# Patient Record
Sex: Female | Born: 1965 | Race: Black or African American | Hispanic: No | Marital: Married | Smoking: Never smoker
Health system: Southern US, Community
[De-identification: ages and names within clinical notes are randomized; demographics above are authoritative.]

## PROBLEM LIST (undated history)

## (undated) DIAGNOSIS — J329 Chronic sinusitis, unspecified: Secondary | ICD-10-CM

## (undated) DIAGNOSIS — Z1371 Encounter for nonprocreative screening for genetic disease carrier status: Secondary | ICD-10-CM

## (undated) DIAGNOSIS — N760 Acute vaginitis: Secondary | ICD-10-CM

## (undated) DIAGNOSIS — K219 Gastro-esophageal reflux disease without esophagitis: Secondary | ICD-10-CM

## (undated) DIAGNOSIS — T7840XA Allergy, unspecified, initial encounter: Secondary | ICD-10-CM

## (undated) DIAGNOSIS — B9689 Other specified bacterial agents as the cause of diseases classified elsewhere: Secondary | ICD-10-CM

## (undated) HISTORY — DX: Encounter for nonprocreative screening for genetic disease carrier status: Z13.71

## (undated) HISTORY — DX: Allergy, unspecified, initial encounter: T78.40XA

## (undated) HISTORY — PX: INDUCED ABORTION: SHX677

## (undated) HISTORY — PX: BREAST EXCISIONAL BIOPSY: SUR124

## (undated) HISTORY — DX: Other specified bacterial agents as the cause of diseases classified elsewhere: N76.0

## (undated) HISTORY — DX: Chronic sinusitis, unspecified: J32.9

## (undated) HISTORY — PX: MYOMECTOMY: SHX85

## (undated) HISTORY — DX: Other specified bacterial agents as the cause of diseases classified elsewhere: B96.89

---

## 1994-09-09 HISTORY — PX: CERVICAL BIOPSY  W/ LOOP ELECTRODE EXCISION: SUR135

## 1998-07-12 ENCOUNTER — Other Ambulatory Visit: Admission: RE | Admit: 1998-07-12 | Discharge: 1998-07-12 | Payer: Self-pay | Admitting: Gynecology

## 1999-07-18 ENCOUNTER — Other Ambulatory Visit: Admission: RE | Admit: 1999-07-18 | Discharge: 1999-07-18 | Payer: Self-pay | Admitting: Gynecology

## 1999-08-17 ENCOUNTER — Encounter: Payer: Self-pay | Admitting: Gynecology

## 1999-08-17 ENCOUNTER — Encounter: Admission: RE | Admit: 1999-08-17 | Discharge: 1999-08-17 | Payer: Self-pay | Admitting: Gynecology

## 1999-08-27 ENCOUNTER — Encounter: Payer: Self-pay | Admitting: Gynecology

## 1999-08-27 ENCOUNTER — Encounter: Admission: RE | Admit: 1999-08-27 | Discharge: 1999-08-27 | Payer: Self-pay | Admitting: Gynecology

## 2000-02-25 ENCOUNTER — Encounter: Admission: RE | Admit: 2000-02-25 | Discharge: 2000-02-25 | Payer: Self-pay | Admitting: Gynecology

## 2000-02-25 ENCOUNTER — Encounter: Payer: Self-pay | Admitting: Gynecology

## 2000-08-11 ENCOUNTER — Other Ambulatory Visit: Admission: RE | Admit: 2000-08-11 | Discharge: 2000-08-11 | Payer: Self-pay | Admitting: Gynecology

## 2000-08-27 ENCOUNTER — Encounter: Admission: RE | Admit: 2000-08-27 | Discharge: 2000-08-27 | Payer: Self-pay | Admitting: Gynecology

## 2000-08-27 ENCOUNTER — Encounter: Payer: Self-pay | Admitting: Gynecology

## 2001-10-16 ENCOUNTER — Encounter (INDEPENDENT_AMBULATORY_CARE_PROVIDER_SITE_OTHER): Payer: Self-pay | Admitting: Specialist

## 2001-10-16 ENCOUNTER — Ambulatory Visit (HOSPITAL_COMMUNITY): Admission: RE | Admit: 2001-10-16 | Discharge: 2001-10-16 | Payer: Self-pay | Admitting: Gynecology

## 2001-11-09 ENCOUNTER — Other Ambulatory Visit: Admission: RE | Admit: 2001-11-09 | Discharge: 2001-11-09 | Payer: Self-pay | Admitting: Gynecology

## 2001-12-04 ENCOUNTER — Encounter: Payer: Self-pay | Admitting: Gynecology

## 2001-12-04 ENCOUNTER — Encounter: Admission: RE | Admit: 2001-12-04 | Discharge: 2001-12-04 | Payer: Self-pay | Admitting: Gynecology

## 2002-12-02 ENCOUNTER — Other Ambulatory Visit: Admission: RE | Admit: 2002-12-02 | Discharge: 2002-12-02 | Payer: Self-pay | Admitting: Gynecology

## 2002-12-21 ENCOUNTER — Encounter: Payer: Self-pay | Admitting: Gynecology

## 2002-12-21 ENCOUNTER — Encounter: Admission: RE | Admit: 2002-12-21 | Discharge: 2002-12-21 | Payer: Self-pay | Admitting: Gynecology

## 2003-01-20 ENCOUNTER — Encounter: Admission: RE | Admit: 2003-01-20 | Discharge: 2003-04-20 | Payer: Self-pay | Admitting: Gynecology

## 2003-12-29 ENCOUNTER — Other Ambulatory Visit: Admission: RE | Admit: 2003-12-29 | Discharge: 2003-12-29 | Payer: Self-pay | Admitting: Gynecology

## 2004-01-05 ENCOUNTER — Encounter: Admission: RE | Admit: 2004-01-05 | Discharge: 2004-01-05 | Payer: Self-pay | Admitting: Gynecology

## 2004-04-12 ENCOUNTER — Encounter: Admission: RE | Admit: 2004-04-12 | Discharge: 2004-04-12 | Payer: Self-pay | Admitting: Gynecology

## 2004-04-24 ENCOUNTER — Encounter: Admission: RE | Admit: 2004-04-24 | Discharge: 2004-04-24 | Payer: Self-pay | Admitting: Interventional Radiology

## 2004-06-09 HISTORY — PX: UTERINE ARTERY EMBOLIZATION: SHX2629

## 2004-06-21 ENCOUNTER — Observation Stay (HOSPITAL_COMMUNITY): Admission: RE | Admit: 2004-06-21 | Discharge: 2004-06-22 | Payer: Self-pay | Admitting: Interventional Radiology

## 2004-12-18 ENCOUNTER — Encounter: Admission: RE | Admit: 2004-12-18 | Discharge: 2004-12-18 | Payer: Self-pay | Admitting: Interventional Radiology

## 2005-01-09 ENCOUNTER — Encounter: Admission: RE | Admit: 2005-01-09 | Discharge: 2005-01-09 | Payer: Self-pay | Admitting: Gynecology

## 2005-01-10 ENCOUNTER — Other Ambulatory Visit: Admission: RE | Admit: 2005-01-10 | Discharge: 2005-01-10 | Payer: Self-pay | Admitting: Gynecology

## 2006-01-21 ENCOUNTER — Encounter: Admission: RE | Admit: 2006-01-21 | Discharge: 2006-01-21 | Payer: Self-pay | Admitting: Gynecology

## 2006-01-30 ENCOUNTER — Other Ambulatory Visit: Admission: RE | Admit: 2006-01-30 | Discharge: 2006-01-30 | Payer: Self-pay | Admitting: Gynecology

## 2007-01-28 ENCOUNTER — Encounter: Admission: RE | Admit: 2007-01-28 | Discharge: 2007-01-28 | Payer: Self-pay | Admitting: Gynecology

## 2007-02-04 ENCOUNTER — Other Ambulatory Visit: Admission: RE | Admit: 2007-02-04 | Discharge: 2007-02-04 | Payer: Self-pay | Admitting: Gynecology

## 2008-02-02 ENCOUNTER — Encounter: Admission: RE | Admit: 2008-02-02 | Discharge: 2008-02-02 | Payer: Self-pay | Admitting: Gynecology

## 2008-02-05 ENCOUNTER — Other Ambulatory Visit: Admission: RE | Admit: 2008-02-05 | Discharge: 2008-02-05 | Payer: Self-pay | Admitting: Gynecology

## 2008-07-25 ENCOUNTER — Ambulatory Visit: Payer: Self-pay | Admitting: Gynecology

## 2009-02-02 ENCOUNTER — Encounter: Admission: RE | Admit: 2009-02-02 | Discharge: 2009-02-02 | Payer: Self-pay | Admitting: Gynecology

## 2009-02-17 ENCOUNTER — Ambulatory Visit: Payer: Self-pay | Admitting: Gynecology

## 2009-02-17 ENCOUNTER — Other Ambulatory Visit: Admission: RE | Admit: 2009-02-17 | Discharge: 2009-02-17 | Payer: Self-pay | Admitting: Gynecology

## 2009-02-17 ENCOUNTER — Encounter: Payer: Self-pay | Admitting: Gynecology

## 2009-02-22 ENCOUNTER — Ambulatory Visit: Payer: Self-pay | Admitting: Gynecology

## 2009-11-11 ENCOUNTER — Emergency Department (HOSPITAL_COMMUNITY): Admission: EM | Admit: 2009-11-11 | Discharge: 2009-11-11 | Payer: Self-pay | Admitting: Family Medicine

## 2010-02-06 ENCOUNTER — Encounter: Admission: RE | Admit: 2010-02-06 | Discharge: 2010-02-06 | Payer: Self-pay | Admitting: Gynecology

## 2010-02-27 ENCOUNTER — Other Ambulatory Visit: Admission: RE | Admit: 2010-02-27 | Discharge: 2010-02-27 | Payer: Self-pay | Admitting: Gynecology

## 2010-02-27 ENCOUNTER — Ambulatory Visit: Payer: Self-pay | Admitting: Gynecology

## 2010-03-05 ENCOUNTER — Ambulatory Visit: Payer: Self-pay | Admitting: Gynecology

## 2010-08-24 ENCOUNTER — Emergency Department (HOSPITAL_COMMUNITY)
Admission: EM | Admit: 2010-08-24 | Discharge: 2010-08-24 | Payer: Self-pay | Source: Home / Self Care | Admitting: Family Medicine

## 2011-01-25 NOTE — Op Note (Signed)
[facility] of [facility]  Patient:    Cassandra Vaughn, Cassandra Vaughn Visit Number: [id number] MRN: [id number]          Service Type: DSU Location: [facility] Attending Physician:  Tonye Royalty Dictated by:   Gaetano Hawthorne. Lily Peer, M.D. Proc. Date: 10/16/01 Admit Date:  10/16/2001                             Operative Report  PREOPERATIVE DIAGNOSES:       1. Dysfunctional uterine bleeding.                               2. Questionable intrauterine polyp, questionable                                  myoma.  POSTOPERATIVE DIAGNOSIS:      Asherman syndrome.  OPERATION:                    Diagnostic hysteroscopy with D&C.  SURGEON:                      Juan H. Lily Peer, M.D.  ANESTHESIA:                   MAC, paracervical block, intravenous sedation.  INDICATIONS FOR PROCEDURE:    A 45 year old gravida 2, para 2, with history of dysfunctional uterine bleeding, history of multiple myomectomies in the past. Office sonohistogram suspicious for uterine segment myoma versus polyp.  The patient had received 11.25 mg of Lupron approximately three months ago.  FINDINGS:                     Extensive intrauterine adhesions were noted. Tubal ostia identified.  Narrow cervical canal.  DESCRIPTION OF PROCEDURE:     After the patient was adequately counseled, she was taken to the operating room where she underwent intravenous sedation.  She was placed in the low lithotomy position.  She received 1 g of Cefotan for prophylaxis preoperatively.  The vagina and perineum were prepped and draped in the usual sterile fashion.  The laminaria that was placed previously in the office was removed with some tension required to remove it due to the narrowness of the cervical canal.  Once this was removed, the vagina was cleansed with Betadine solution.  One percent lidocaine was infiltrated into the cervical stroma at the 2, 4, 8, and 10 oclock position.  The uterus sounded to approximately 7  cm and Pratt dilator to a size 23 was safely introduced into the endocervical canal into the uterine cavity.  Once this was accomplished, a small diagnostic hysteroscope was introduced into the intrauterine cavity.  Three percent sorbitol was the distending media, and initially, the anterior uterine pressure was at 80 mmHg, and was increased to 100 mm in effort to distend the uterus for better visualization. It was somewhat difficult due to the amount of adhesions that were present, and so vigorous curettage was performed, interchanged with the suction curet, followed by reinsertion of the diagnostic scope.  At this time, both tubal ostia were identified, and the intrauterine adhesions had been broken down, and appears into the angle of the uterus that may have been a missed conception of whether it was polyp or myoma in the lower uterine segment of  the uterus, and was not seen at this time after verification of the intrauterine cavity.  Once this was completed, the tenaculum was removed, and the patient was awakened, and transferred to the recovery room with stable vital signs.  She did receive 30 mg of Toradol on arrival to the recovery room.  Blood loss was minimal.  Fluid deficit from the 3% sorbitol distending media was less than 30 cc.  The patient received a total of 900 cc of lactated Ringers. Dictated by:   Gaetano Hawthorne Lily Peer, M.D. Attending Physician:  Tonye Royalty DD:  10/16/01 TD:  10/17/01 Job: [id number]

## 2011-01-25 NOTE — H&P (Signed)
[facility] of [facility]  Patient:    Cassandra Vaughn, Cassandra Vaughn Visit Number: [id number] MRN: [id number]          Service Type: Attending:  Gaetano Hawthorne. Lily Peer, M.D. Dictated by:   Gaetano Hawthorne Lily Peer, M.D. Adm. Date:  10/16/01                           History and Physical  CHIEF COMPLAINT:              1. Dysfunctional uterine bleeding.                               2. 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  HISTORY:                      The patient is a 45 year old gravida 2, para 2 who was seen in the office on August 26, 2001.  Patient in 1998 had an abdominal myomectomy whereby numerous fibroids had been removed.  She was seen in the office on July 09, 2001 and prior to that had been complaining of irregular menses for the previous four to five months.  She had been on Ortho-Novum 7/7/7, but was discontinued since she had reached the age of 27 and had been switched to 20 mcg pill such as Alesse.  She has had a sonohysterogram and an endometrial biopsy.  The sonohysterogram had demonstrated posterior wall defect measuring 18 x 10 x 17 mm questionable fibroid versus a polyp.  Attempt at endometrial biopsy was difficult and due to stenotic os she was placed on Lupron 11.25 mg IM and when she returned to the office required serial dilatation of her cervix in effort to facilitate the insertion of the catheter to proceed with a sonohysterogram.  Endometrial biopsy was attempted but minimal tissue was obtained at that time.  She was seen in the office today, February 6, for placement of a laminaria which required also local paracervical block, serial dilatation of the cervix, and placement of the laminaria tent.  Patients last Pap smear was November 2001 which was normal.  ALLERGIES:                    Denies.  PAST MEDICAL HISTORY:         She had a myomectomy in 1998.  She had received Lupron 11.25 in November.  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  She has had LEEP cervical conization 1996.  She has suspicious mammograms but she has had mammograms every six months.  FAMILY HISTORY:               Sister with history of breast cancer.  Father with history of diabetes and hypertension.  PHYSICAL EXAMINATION  GENERAL:                      Well-developed, well-nourished female.  HEENT:                        Unremarkable.  NECK:                         Supple.  Trachea:  Midline.  No carotid bruits. No thyromegaly.  LUNGS:  Clear to auscultation without rhonchi or wheezes.  HEART:                        Regular rate and rhythm.  No murmurs or gallops.  BREASTS:                      Not done.  ABDOMEN:                      Soft, nontender without rebound or guarding.  PELVIC:                       Bartholin, urethral, and Skenes glands within normal limits.  Vagina, cervix:  No lesions or discharge.  Cervix:  Slightly retroverted.  Normal size, shape, and consistency.  Adnexa without any mass or tenderness.  RECTAL:                       Deferred.  ASSESSMENT:                   A 45 year old gravida 2, para 2 with history of dysfunctional uterine bleeding attributed to possible endometrial polyp versus submucous myoma in the posterior uterine wall.  Patient received Lupron 11.25 mg in November and had laminaria placed intracervically today in effort to facilitate the insertion of the hysteroscope at the time of her surgery.  She had a CBC in the office back on January 27 with hemoglobin and hematocrit of 13.9 and 41.4 respectively with a normal platelet count.  Risks, benefits, pros, and cons of resectoscopic polypectomy and myomectomy were discussed including infection, bleeding, trauma to internal organs, perforation of the uterus.  She will receive prophylactic antibiotics, 1 g Cefotan.  Of note, today in the office she underwent paracervical block whereby 1%  lidocaine was infiltrated into the cervical stroma at the 2, 4, 8, and 10 oclock position and required serial dilatation of the cervix to safely place the laminaria. She was also given a prescription for Lortab 7.5/500 to take one p.o. q.4-6h. p.r.n. after the procedure.  All questions were answered and will follow accordingly.  PLAN:                         Patient is scheduled for resectoscopic polypectomy/myomectomy and endometrial biopsy tomorrow at [facility] at 7:30 a.m. Dictated by:   Gaetano Hawthorne Lily Peer, M.D. Attending:  Gaetano Hawthorne. Lily Peer, M.D. DD:  10/15/01 TD:  10/15/01 Job: [id number]

## 2011-02-18 ENCOUNTER — Other Ambulatory Visit: Payer: Self-pay | Admitting: Gynecology

## 2011-02-18 DIAGNOSIS — Z1231 Encounter for screening mammogram for malignant neoplasm of breast: Secondary | ICD-10-CM

## 2011-02-28 ENCOUNTER — Ambulatory Visit
Admission: RE | Admit: 2011-02-28 | Discharge: 2011-02-28 | Disposition: A | Discharge status: Home / Self Care | Payer: BC Managed Care – PPO | Source: Ambulatory Visit | Attending: Gynecology | Admitting: Gynecology

## 2011-02-28 DIAGNOSIS — Z1231 Encounter for screening mammogram for malignant neoplasm of breast: Secondary | ICD-10-CM

## 2011-03-14 ENCOUNTER — Encounter: Payer: Self-pay | Admitting: Gynecology

## 2011-03-19 ENCOUNTER — Encounter (INDEPENDENT_AMBULATORY_CARE_PROVIDER_SITE_OTHER): Payer: BC Managed Care – PPO | Admitting: Gynecology

## 2011-03-19 ENCOUNTER — Other Ambulatory Visit: Payer: Self-pay | Admitting: Gynecology

## 2011-03-19 ENCOUNTER — Other Ambulatory Visit (HOSPITAL_COMMUNITY)
Admission: RE | Admit: 2011-03-19 | Discharge: 2011-03-19 | Disposition: A | Discharge status: Home / Self Care | Payer: BC Managed Care – PPO | Source: Ambulatory Visit | Attending: Gynecology | Admitting: Gynecology

## 2011-03-19 DIAGNOSIS — Z833 Family history of diabetes mellitus: Secondary | ICD-10-CM

## 2011-03-19 DIAGNOSIS — Z124 Encounter for screening for malignant neoplasm of cervix: Secondary | ICD-10-CM | POA: Insufficient documentation

## 2011-03-19 DIAGNOSIS — R635 Abnormal weight gain: Secondary | ICD-10-CM

## 2011-03-19 DIAGNOSIS — Z01419 Encounter for gynecological examination (general) (routine) without abnormal findings: Secondary | ICD-10-CM

## 2011-03-19 DIAGNOSIS — Z13220 Encounter for screening for lipoid disorders: Secondary | ICD-10-CM

## 2011-03-20 ENCOUNTER — Encounter: Payer: Self-pay | Admitting: *Deleted

## 2011-03-26 ENCOUNTER — Other Ambulatory Visit: Payer: BC Managed Care – PPO

## 2011-03-26 ENCOUNTER — Ambulatory Visit (INDEPENDENT_AMBULATORY_CARE_PROVIDER_SITE_OTHER): Payer: BC Managed Care – PPO | Admitting: Gynecology

## 2011-03-26 DIAGNOSIS — N920 Excessive and frequent menstruation with regular cycle: Secondary | ICD-10-CM

## 2011-03-26 DIAGNOSIS — D259 Leiomyoma of uterus, unspecified: Secondary | ICD-10-CM

## 2011-03-26 DIAGNOSIS — N8300 Follicular cyst of ovary, unspecified side: Secondary | ICD-10-CM

## 2011-03-26 DIAGNOSIS — N852 Hypertrophy of uterus: Secondary | ICD-10-CM

## 2012-03-03 ENCOUNTER — Other Ambulatory Visit: Payer: Self-pay | Admitting: Gynecology

## 2012-03-03 DIAGNOSIS — Z1231 Encounter for screening mammogram for malignant neoplasm of breast: Secondary | ICD-10-CM

## 2012-03-23 ENCOUNTER — Ambulatory Visit
Admission: RE | Admit: 2012-03-23 | Discharge: 2012-03-23 | Disposition: A | Discharge status: Home / Self Care | Payer: BC Managed Care – PPO | Source: Ambulatory Visit | Attending: Gynecology | Admitting: Gynecology

## 2012-03-23 DIAGNOSIS — Z1231 Encounter for screening mammogram for malignant neoplasm of breast: Secondary | ICD-10-CM

## 2012-03-24 ENCOUNTER — Other Ambulatory Visit (HOSPITAL_COMMUNITY)
Admission: RE | Admit: 2012-03-24 | Discharge: 2012-03-24 | Disposition: A | Discharge status: Home / Self Care | Payer: BC Managed Care – PPO | Source: Ambulatory Visit | Attending: Gynecology | Admitting: Gynecology

## 2012-03-24 ENCOUNTER — Encounter: Payer: Self-pay | Admitting: Gynecology

## 2012-03-24 ENCOUNTER — Ambulatory Visit (INDEPENDENT_AMBULATORY_CARE_PROVIDER_SITE_OTHER): Payer: BC Managed Care – PPO | Admitting: Gynecology

## 2012-03-24 VITALS — BP 120/76 | Ht 65.0 in | Wt 150.0 lb

## 2012-03-24 DIAGNOSIS — Z833 Family history of diabetes mellitus: Secondary | ICD-10-CM | POA: Insufficient documentation

## 2012-03-24 DIAGNOSIS — Z01419 Encounter for gynecological examination (general) (routine) without abnormal findings: Secondary | ICD-10-CM

## 2012-03-24 DIAGNOSIS — D259 Leiomyoma of uterus, unspecified: Secondary | ICD-10-CM

## 2012-03-24 DIAGNOSIS — Z1151 Encounter for screening for human papillomavirus (HPV): Secondary | ICD-10-CM | POA: Insufficient documentation

## 2012-03-24 DIAGNOSIS — R634 Abnormal weight loss: Secondary | ICD-10-CM

## 2012-03-24 LAB — CBC WITH DIFFERENTIAL/PLATELET
Basophils Absolute: 0 10*3/uL (ref 0.0–0.1)
Basophils Relative: 0 % (ref 0–1)
Eosinophils Absolute: 0 10*3/uL (ref 0.0–0.7)
Eosinophils Relative: 1 % (ref 0–5)
HCT: 44.7 % (ref 36.0–46.0)
Hemoglobin: 15 g/dL (ref 12.0–15.0)
Lymphocytes Relative: 40 % (ref 12–46)
Lymphs Abs: 1.9 10*3/uL (ref 0.7–4.0)
MCH: 31.1 pg (ref 26.0–34.0)
MCHC: 33.6 g/dL (ref 30.0–36.0)
MCV: 92.5 fL (ref 78.0–100.0)
Monocytes Absolute: 0.5 10*3/uL (ref 0.1–1.0)
Monocytes Relative: 11 % (ref 3–12)
Neutro Abs: 2.3 10*3/uL (ref 1.7–7.7)
Neutrophils Relative %: 48 % (ref 43–77)
Platelets: 243 10*3/uL (ref 150–400)
RBC: 4.83 MIL/uL (ref 3.87–5.11)
RDW: 13.8 % (ref 11.5–15.5)
WBC: 4.8 10*3/uL (ref 4.0–10.5)

## 2012-03-24 LAB — LIPID PANEL
Cholesterol: 227 mg/dL — ABNORMAL HIGH (ref 0–200)
HDL: 92 mg/dL (ref >39)
LDL Cholesterol: 119 mg/dL — ABNORMAL HIGH (ref 0–99)
Total CHOL/HDL Ratio: 2.5 Ratio
Triglycerides: 81 mg/dL (ref <150)
VLDL: 16 mg/dL (ref 0–40)

## 2012-03-24 LAB — TSH: TSH: 1.697 u[IU]/mL (ref 0.350–4.500)

## 2012-03-24 LAB — GLUCOSE, RANDOM: Glucose, Bld: 77 mg/dL (ref 70–99)

## 2012-03-24 MED ORDER — LEVONORGESTREL-ETHINYL ESTRAD 0.1-20 MG-MCG PO TABS: 1.0000 | ORAL_TABLET | Freq: Every day | ORAL | Status: DC

## 2012-03-24 NOTE — Patient Instructions (Addendum)

## 2012-03-24 NOTE — Progress Notes (Signed)
Cassandra Vaughn [birth year] [id number]   History:    46 y.o.  for annual gyn exam with no complaints today. Patient on oral contraceptive pills and is having normal menstrual cycle. Patient had abdominal myomectomy in 1996 and 2005. She also had a uterine artery embolization 2005. Patient a LEEP cervical conization in 1996 followup Pap smears have been normal. Patient's sister with history of breast cancer. Patient has been tested for BRCA1 and BRCA2 mutation and was negative. Patient frequently does her self breast examination. Her mammogram was done yesterday results pending at time of this dictation. Patient is exercising eating better she was weighing 159 is down to 150. Patient with strong family history of diabetes.  Past medical history,surgical history, family history and social history were all reviewed and documented in the EPIC chart.  Gynecologic History Patient's last menstrual period was 03/17/2012. Contraception: OCP (estrogen/progesterone) Last Pap: 2012. Results were: normal Last mammogram: 2013. Results were: Results pending  Obstetric History OB History    Grav Para Term Preterm Abortions TAB SAB Ect Mult Living   2 0   2     0     # Outc Date GA Lbr Len/2nd Wgt Sex Del Anes PTL Lv   1 ABT            2 ABT                ROS: A ROS was performed and pertinent positives and negatives are included in the history.  GENERAL: No fevers or chills. HEENT: No change in vision, no earache, sore throat or sinus congestion. NECK: No pain or stiffness. CARDIOVASCULAR: No chest pain or pressure. No palpitations. PULMONARY: No shortness of breath, cough or wheeze. GASTROINTESTINAL: No abdominal pain, nausea, vomiting or diarrhea, melena or bright red blood per rectum. GENITOURINARY: No urinary frequency, urgency, hesitancy or dysuria. MUSCULOSKELETAL: No joint or muscle pain, no back pain, no recent trauma. DERMATOLOGIC: No rash, no itching, no lesions. ENDOCRINE: No polyuria, polydipsia,  no heat or cold intolerance. No recent change in weight. HEMATOLOGICAL: No anemia or easy bruising or bleeding. NEUROLOGIC: No headache, seizures, numbness, tingling or weakness. PSYCHIATRIC: No depression, no loss of interest in normal activity or change in sleep pattern.     Exam: chaperone present  BP 120/76  Ht 5\' 5"  (1.651 m)  Wt 150 lb (68.04 kg)  BMI 24.96 kg/m2  LMP 03/17/2012  Body mass index is 24.96 kg/(m^2).  General appearance : Well developed well nourished female. No acute distress HEENT: Neck supple, trachea midline, no carotid bruits, no thyroidmegaly Lungs: Clear to auscultation, no rhonchi or wheezes, or rib retractions  Heart: Regular rate and rhythm, no murmurs or gallops Breast:Examined in sitting and supine position were symmetrical in appearance, no palpable masses or tenderness,  no skin retraction, no nipple inversion, no nipple discharge, no skin discoloration, no axillary or supraclavicular lymphadenopathy Abdomen: no palpable masses or tenderness, no rebound or guarding Extremities: no edema or skin discoloration or tenderness  Pelvic:  Bartholin, Urethra, Skene Glands: Within normal limits             Vagina: No gross lesions or discharge  Cervix: No gross lesions or discharge  Uterus  14 week size irregular shaped, nontender  Adnexa  difficult to assess due to patient's uterine size  Anus and perineum  normal   Rectovaginal  normal sphincter tone without palpated masses or tenderness             Hemoccult not  done     Assessment/Plan:  46 y.o. female for annual exam with history of leiomyomatous uteri. Uterus today 14 week size unable to assess adnexa due to the size of the uterus. Patient will return back to the office next week for an ultrasound to assess her adnexa as well as to compare uterine size with last years ultrasound scan. Patient has been asymptomatic otherwise and is having normal menstrual cycles. New Pap smear screening guidelines were  discussed. Pap smear was done today. We will try to obtain the records from the LEEP cervical conization and the Pap smear in 1996 to see the grade of dysplasia present to determine followup. The following labs are today: Fasting lipid profile, fasting blood sugar, TSH, CBC, and urinalysis. Patient was encouraged to continue her monthly self breast examination.    Ok Edwards MD, 11:34 AM 03/24/2012

## 2012-03-25 ENCOUNTER — Other Ambulatory Visit: Payer: Self-pay | Admitting: Gynecology

## 2012-03-25 DIAGNOSIS — E7800 Pure hypercholesterolemia, unspecified: Secondary | ICD-10-CM

## 2012-03-25 LAB — URINALYSIS W MICROSCOPIC + REFLEX CULTURE
Bilirubin Urine: NEGATIVE
Casts: NONE SEEN
Crystals: NONE SEEN
Glucose, UA: NEGATIVE mg/dL
Hgb urine dipstick: NEGATIVE
Ketones, ur: NEGATIVE mg/dL
Leukocytes, UA: NEGATIVE
Nitrite: NEGATIVE
Protein, ur: NEGATIVE mg/dL
Specific Gravity, Urine: 1.02 (ref 1.005–1.030)
Squamous Epithelial / HPF: NONE SEEN
Urobilinogen, UA: 0.2 mg/dL (ref 0.0–1.0)
pH: 7.5 (ref 5.0–8.0)

## 2012-04-01 ENCOUNTER — Ambulatory Visit (INDEPENDENT_AMBULATORY_CARE_PROVIDER_SITE_OTHER): Payer: BC Managed Care – PPO | Admitting: Gynecology

## 2012-04-01 ENCOUNTER — Encounter: Payer: Self-pay | Admitting: Gynecology

## 2012-04-01 ENCOUNTER — Other Ambulatory Visit: Payer: Self-pay | Admitting: Gynecology

## 2012-04-01 ENCOUNTER — Ambulatory Visit (INDEPENDENT_AMBULATORY_CARE_PROVIDER_SITE_OTHER): Payer: BC Managed Care – PPO

## 2012-04-01 VITALS — BP 124/80

## 2012-04-01 DIAGNOSIS — D259 Leiomyoma of uterus, unspecified: Secondary | ICD-10-CM

## 2012-04-01 DIAGNOSIS — D251 Intramural leiomyoma of uterus: Secondary | ICD-10-CM

## 2012-04-01 DIAGNOSIS — D252 Subserosal leiomyoma of uterus: Secondary | ICD-10-CM

## 2012-04-01 DIAGNOSIS — N852 Hypertrophy of uterus: Secondary | ICD-10-CM

## 2012-04-01 NOTE — Patient Instructions (Signed)
Total Laparoscopic Hysterectomy A total laparoscopic hysterectomy is a minimally invasive surgery to remove your uterus and cervix. This surgery is performed by making several small cuts (incisions) in your abdomen. It can also be done with a thin, lighted tube (laparoscope) inserted into 2 small incisions in the lower abdomen. Your fallopian tubes and ovaries can be removed (bilateral salpingo-oopherectomy) during this surgery as well.If a total laparoscopic hysterectomy is started and it is not safe to continue, the laparoscopic surgery will be converted to an open abdominal surgery. You will not have menstrual periods or be able to get pregnant after having this surgery. If a bilateral salpingo-oopherectomy was performed before menopause, you will go through a sudden (abrupt) menopause. This can be helped with hormone medicines. Benefits of minimally invasive surgery include:  Less pain.   Less risk of blood loss.   Less risk of infection.   Quicker return to normal activities.   Usually a 1 night stay in the hospital.   Overall patient satisfaction.  LET YOUR CAREGIVER KNOW ABOUT:  Any history of abnormal Pap tests.   Allergies to food or medicine.   Medicines taken, including vitamins, herbs, eyedrops, over-the-counter medicines, and creams.   Use of steroids (by mouth or creams).   Previous problems with anesthetics or numbing medicines.   History of bleeding problems or blood clots.   Previous surgery.   Other health problems, including diabetes and kidney problems.   Desire for future fertility.   Any infections or colds you may have developed.   Symptoms of irregular or heavy periods, weight loss, or urinary or bowel changes.  RISKS AND COMPLICATIONS   Bleeding.   Blood clots in the legs or lung.   Infection.   Injury to surrounding organs.   Problems with anesthesia.   Early menopause symptoms (hot flashes, night sweats, insomnia).   Risk of conversion  to an open abdominal incision.  BEFORE THE PROCEDURE  Ask your caregiver about changing or stopping your regular medicines.   Do not take aspirin or blood thinners (anticoagulants) for 1 week before the surgery, or as told by your caregiver.   Do not eat or drink anything for 8 hours before the surgery, or as told by your caregiver.   Quit smoking if you smoke.   Arrange for a ride home after surgery and for someone to help you at home during recovery.  PROCEDURE   You will be given antibiotic medicine.   An intravenous (IV) line will be placed in your arm. You will be given medicine to make you sleep (general anesthetic).   A gas (carbon dioxide) will be used to inflate your abdomen. This will allow your surgeon to look inside your abdomen, perform your surgery, and treat any other problems found if necessary.   Three or four small incisions (often less than  inch) will be made in your abdomen. One of these incisions will be made in the area of your belly button (navel). The laparoscope will be inserted into the incision. Your surgeon will look through the laparoscope while doing your procedure.   Other surgical instruments will be inserted through the other incisions.   The uterus may be removed through the vagina or cut into small pieces and removed through the small incisions.   Your incisions will be closed.  AFTER THE PROCEDURE  The gas will be released from inside your abdomen.   You will be taken to the recovery area where a nurse will watch and   check your progress. Once you are awake, stable, and taking fluids well, without other problems, you will return to your room or be allowed to go home.   There is usually minimal discomfort following the surgery because the incisions are so small.   You will be given pain medicine while you are in the hospital and for when you go home.   Try to have someone with you the first 3 to 5 days after you go home.   Follow up with  your surgeon in 2 to 4 weeks after surgery to evaluate your progress.  Document Released: 06/23/2007 Document Revised: 08/15/2011 Document Reviewed: 04/12/2011 [facility] Patient Information 2012 [place], [place].  Fibroids You have been diagnosed as having a fibroid. Fibroids are smooth muscle lumps (tumors) which can occur any place in a woman's body. They are usually in the womb (uterus). The most common problem (symptom) of fibroids is bleeding. Over time this may cause low red blood cells (anemia). Other symptoms include feelings of pressure and pain in the pelvis. The diagnosis (learning what is wrong) of fibroids is made by physical exam. Sometimes tests such as an ultrasound are used. This is helpful when fibroids are felt around the ovaries and to look for tumors. TREATMENT   Most fibroids do not need surgical or medical treatment. Sometimes a tissue sample (biopsy) of the lining of the uterus is done to rule out cancer. If there is no cancer and only a small amount of bleeding, the problem can be watched.   Hormonal treatment can improve the problem.   When surgery is needed, it can consist of removing the fibroid. Vaginal birth may not be possible after the removal of fibroids. This depends on where they are and the extent of surgery. When pregnancy occurs with fibroids it is usually normal.   Your caregiver can help decide which treatments are best for you.  HOME CARE INSTRUCTIONS   Do not use aspirin as this may increase bleeding problems.   If your periods (menses) are heavy, record the number of pads or tampons used per month. Bring this information to your caregiver. This can help them determine the best treatment for you.  SEEK IMMEDIATE MEDICAL CARE IF:  You have pelvic pain or cramps not controlled with medications, or experience a sudden increase in pain.   You have an increase of pelvic bleeding between and during menses.   You feel lightheaded or have fainting spells.    You develop worsening belly (abdominal) pain.  Document Released: 08/23/2000 Document Revised: 08/15/2011 Document Reviewed: 04/14/2008 [facility] Patient Information 2012 [place], [place].

## 2012-04-01 NOTE — Progress Notes (Signed)
Patient seen the office on July 16 for her annual exam and her uterus felt irregular approximately 12-14 week size.Patient had abdominal myomectomy in 1996 and 2005. She also had a uterine artery embolization 2005. She presented to the office today for followup ultrasound to compare with previous year scan. She is having normal menstrual cycles and is otherwise asymptomatic.  Ultrasound: Uterus measured 10.1 x 9.4 x 6.4 cm right ovary with normal dimensions left ovary not seen patient was numerous intramural and subserosal myoma the largest ones measured as follows: 26 x 20 mm, 30 x 26 mm, 40 x 20 mm, 31 x 33 mm, 46 x 44 mm, and 51 x 20 mm. Unable to identify the endometrium.  Ultrasound compared with 2012 essentially no change. Since patient is asymptomatic she does not want to proceed at this time with any surgical intervention. I have given her literature information on total laparoscopic hysterectomy. If she becomes symptomatic and decides to proceed with procedure she will contact the office. We will continue to followup with ultrasound annual A.. All the above was discussed with the patient and literature formation was provided all questions rancher will follow accordingly.

## 2013-01-19 ENCOUNTER — Other Ambulatory Visit: Payer: Self-pay | Admitting: *Deleted

## 2013-01-19 MED ORDER — LEVONORGESTREL-ETHINYL ESTRAD 0.1-20 MG-MCG PO TABS: 1.0000 | ORAL_TABLET | Freq: Every day | ORAL | Status: DC

## 2013-03-04 ENCOUNTER — Other Ambulatory Visit: Payer: Self-pay

## 2013-03-04 DIAGNOSIS — Z1231 Encounter for screening mammogram for malignant neoplasm of breast: Secondary | ICD-10-CM

## 2013-03-29 ENCOUNTER — Ambulatory Visit
Admission: RE | Admit: 2013-03-29 | Discharge: 2013-03-29 | Disposition: A | Discharge status: Home / Self Care | Payer: BC Managed Care – PPO | Source: Ambulatory Visit

## 2013-03-29 DIAGNOSIS — Z1231 Encounter for screening mammogram for malignant neoplasm of breast: Secondary | ICD-10-CM

## 2013-04-06 ENCOUNTER — Ambulatory Visit (INDEPENDENT_AMBULATORY_CARE_PROVIDER_SITE_OTHER): Payer: BC Managed Care – PPO | Admitting: Gynecology

## 2013-04-06 ENCOUNTER — Encounter: Payer: Self-pay | Admitting: Gynecology

## 2013-04-06 ENCOUNTER — Other Ambulatory Visit (HOSPITAL_COMMUNITY)
Admission: RE | Admit: 2013-04-06 | Discharge: 2013-04-06 | Disposition: A | Discharge status: Home / Self Care | Payer: BC Managed Care – PPO | Source: Ambulatory Visit | Attending: Gynecology | Admitting: Gynecology

## 2013-04-06 VITALS — BP 124/76 | Ht 65.0 in | Wt 150.0 lb

## 2013-04-06 DIAGNOSIS — N898 Other specified noninflammatory disorders of vagina: Secondary | ICD-10-CM

## 2013-04-06 DIAGNOSIS — E785 Hyperlipidemia, unspecified: Secondary | ICD-10-CM

## 2013-04-06 DIAGNOSIS — Z01419 Encounter for gynecological examination (general) (routine) without abnormal findings: Secondary | ICD-10-CM | POA: Insufficient documentation

## 2013-04-06 DIAGNOSIS — D259 Leiomyoma of uterus, unspecified: Secondary | ICD-10-CM

## 2013-04-06 DIAGNOSIS — Z1151 Encounter for screening for human papillomavirus (HPV): Secondary | ICD-10-CM | POA: Insufficient documentation

## 2013-04-06 DIAGNOSIS — Z23 Encounter for immunization: Secondary | ICD-10-CM

## 2013-04-06 LAB — CBC WITH DIFFERENTIAL/PLATELET
Eosinophils Relative: 1 % (ref 0–5)
HCT: 44.9 % (ref 36.0–46.0)
Lymphocytes Relative: 40 % (ref 12–46)
Lymphs Abs: 2.1 10*3/uL (ref 0.7–4.0)
MCV: 93.5 fL (ref 78.0–100.0)
Monocytes Absolute: 0.4 10*3/uL (ref 0.1–1.0)
Monocytes Relative: 7 % (ref 3–12)
RBC: 4.8 MIL/uL (ref 3.87–5.11)
WBC: 5.3 10*3/uL (ref 4.0–10.5)

## 2013-04-06 LAB — WET PREP FOR TRICH, YEAST, CLUE: Yeast Wet Prep HPF POC: NONE SEEN

## 2013-04-06 MED ORDER — METRONIDAZOLE 500 MG PO TABS: 500.0000 mg | ORAL_TABLET | Freq: Two times a day (BID) | ORAL | Status: DC

## 2013-04-06 MED ORDER — LEVONORGESTREL-ETHINYL ESTRAD 0.1-20 MG-MCG PO TABS: 1.0000 | ORAL_TABLET | Freq: Every day | ORAL | Status: DC

## 2013-04-06 NOTE — Patient Instructions (Addendum)

## 2013-04-07 ENCOUNTER — Encounter: Payer: Self-pay | Admitting: Gynecology

## 2013-04-07 LAB — LIPID PANEL
LDL Cholesterol: 106 mg/dL — ABNORMAL HIGH (ref 0–99)
Total CHOL/HDL Ratio: 2.4 Ratio
VLDL: 14 mg/dL (ref 0–40)

## 2013-04-07 LAB — URINALYSIS W MICROSCOPIC + REFLEX CULTURE
Hgb urine dipstick: NEGATIVE
Leukocytes, UA: NEGATIVE
Protein, ur: NEGATIVE mg/dL
Squamous Epithelial / LPF: NONE SEEN
Urobilinogen, UA: 0.2 mg/dL (ref 0.0–1.0)

## 2013-04-07 LAB — COMPREHENSIVE METABOLIC PANEL
BUN: 12 mg/dL (ref 6–23)
CO2: 27 mEq/L (ref 19–32)
Calcium: 9.6 mg/dL (ref 8.4–10.5)
Chloride: 101 mEq/L (ref 96–112)
Creat: 0.94 mg/dL (ref 0.50–1.10)

## 2013-04-07 NOTE — Progress Notes (Signed)
Cassandra Vaughn [birth year] [id number]   History:    47 y.o.  for annual gyn exam and was complaining of a vaginal discharge with odor. The patient was not interested in an STD screen. Patient stable relationship. Patient denied fever chills nausea vomiting no dysuria or frequency. The patient's past GYN history as follows: Patient with past history of fibroid uterus has had abdominal myomectomy in 1996 and 2005. She also had uterine artery embolization 2005. Ultrasound last July demonstrated uterus that measured 10.1 x 9.4 x 6.4 cm with numerous intramural subserosal myomas the largest one measuring 51 x 20 mm. Ovaries are otherwise normal. When compared with ultrasound 2012 it had been no change. Patient is currently on Alesse 28 day oral contraceptive pills and states that she is having normal menstrual cycle.  Patient has history of LEEP cervical conization back in 1996 with normal yearly followup Pap smears. Patient with strong family history of breast cancer in her sister.patient has previously been tested for the BRCA1 and BRCA2 gene mutation was negative.   Past medical history,surgical history, family history and social history were all reviewed and documented in the EPIC chart.  Gynecologic History Patient's last menstrual period was 03/23/2013. Contraception: OCP (estrogen/progesterone) Last Pap: 2013. Results were: normal Last mammogram: 2014. Results were: normal but dense  Obstetric History OB History   Grav Para Term Preterm Abortions TAB SAB Ect Mult Living   2 0   2     0     # Outc Date GA Lbr Len/2nd Wgt Sex Del Anes PTL Lv   1 ABT            2 ABT                ROS: A ROS was performed and pertinent positives and negatives are included in the history.  GENERAL: No fevers or chills. HEENT: No change in vision, no earache, sore throat or sinus congestion. NECK: No pain or stiffness. CARDIOVASCULAR: No chest pain or pressure. No palpitations. PULMONARY: No shortness of  breath, cough or wheeze. GASTROINTESTINAL: No abdominal pain, nausea, vomiting or diarrhea, melena or bright red blood per rectum. GENITOURINARY: No urinary frequency, urgency, hesitancy or dysuria. MUSCULOSKELETAL: No joint or muscle pain, no back pain, no recent trauma. DERMATOLOGIC: No rash, no itching, no lesions. ENDOCRINE: No polyuria, polydipsia, no heat or cold intolerance. No recent change in weight. HEMATOLOGICAL: No anemia or easy bruising or bleeding. NEUROLOGIC: No headache, seizures, numbness, tingling or weakness. PSYCHIATRIC: No depression, no loss of interest in normal activity or change in sleep pattern.     Exam: chaperone present  BP 124/76  Ht 5\' 5"  (1.651 m)  Wt 150 lb (68.04 kg)  BMI 24.96 kg/m2  LMP 03/23/2013  Body mass index is 24.96 kg/(m^2).  General appearance : Well developed well nourished female. No acute distress HEENT: Neck supple, trachea midline, no carotid bruits, no thyroidmegaly Lungs: Clear to auscultation, no rhonchi or wheezes, or rib retractions  Heart: Regular rate and rhythm, no murmurs or gallops Breast:Examined in sitting and supine position were symmetrical in appearance, no palpable masses or tenderness,  no skin retraction, no nipple inversion, no nipple discharge, no skin discoloration, no axillary or supraclavicular lymphadenopathy Abdomen: no palpable masses or tenderness, no rebound or guarding Extremities: no edema or skin discoloration or tenderness  Pelvic:  Bartholin, Urethra, Skene Glands: Within normal limits             Vagina: No gross lesions or discharge  Cervix: No gross lesions or discharge  Uterus  12 week size irregular shaped, normal size, shape and consistency, non-tender and mobile  Adnexa  Difficult to evaluate due to the size of the uterus  Anus and perineum  normal   Rectovaginal  normal sphincter tone without palpated masses or tenderness             Hemoccult None indicated   Wet prep few WBC moderate  bacteria  Assessment/Plan:  47 y.o. female for annual exam will return back to the office in 1-2 weeks for an ultrasound to monitor her her fibroid uterus and better assessment of her adnexa. Because of the moderate bacteria and a few white blood cells she will be placed on Flagyl 500 mg one by mouth twice a day for 5 days. Prescription refill for her oral contraceptive pill was provided. Following labs were ordered today:TSH, fasting lipid profile, CBC, and urinalysis as well as Pap smear.we also did a comprehensive metabolic panel. Patient was reminded to do her monthly self breast examination. We discussed importance of calcium vitamin D and regular exercise for osteoporosis prevention.patient received the Tdap vaccine today.    Ok Edwards MD, 8:21 AM 04/07/2013

## 2013-04-08 LAB — URINE CULTURE: Colony Count: 8000

## 2013-04-16 ENCOUNTER — Ambulatory Visit (INDEPENDENT_AMBULATORY_CARE_PROVIDER_SITE_OTHER): Payer: BC Managed Care – PPO

## 2013-04-16 ENCOUNTER — Ambulatory Visit (INDEPENDENT_AMBULATORY_CARE_PROVIDER_SITE_OTHER): Payer: BC Managed Care – PPO | Admitting: Gynecology

## 2013-04-16 DIAGNOSIS — D259 Leiomyoma of uterus, unspecified: Secondary | ICD-10-CM

## 2013-04-16 NOTE — Progress Notes (Signed)
Patient presented to the office today for ultrasound to compare with previous study done last year as a result of her leiomyomatous uteri. Patient was seen in the office for her annual exam in July 24 of this year.ultrasound in 2013 as follows:  Ultrasound:  Uterus measured 10.1 x 9.4 x 6.4 cm right ovary with normal dimensions left ovary not seen patient was numerous intramural and subserosal myoma the largest ones measured as follows: 26 x 20 mm, 30 x 26 mm, 40 x 20 mm, 31 x 33 mm, 46 x 44 mm, and 51 x 20 mm. Unable to identify the endometrium.  Ultrasound today: Uterus measured 10.4 x 8.4 x 6.9 cm endometrial stripe is 4.7 mm. 6 fibroids were noted the largest one measures 6.3 x 4.5 cm. Ovaries appeared to be normal.  Patient had abdominal myomectomy in 1996 and 2005. She also had a uterine artery embolization 2005. Patient a LEEP cervical conization in 1996 followup Pap smears have been normal. Patient's sister with history of breast cancer. Patient has been tested for BRCA1 and BRCA2 mutation and was negative.  Patient reports no bleeding occasional cramping and patient has been hesitant about proceeding with hysterectomy.the patient is leaning more perhaps later in the year to proceed with total laparoscopic hysterectomy with ovarian conservation. Literature information was provided she will contact us and we'll schedule accordingly along with the preop exam.

## 2013-04-16 NOTE — Patient Instructions (Addendum)
Total Laparoscopic Hysterectomy A total laparoscopic hysterectomy is a minimally invasive surgery to remove your uterus and cervix. This surgery is performed by making several small cuts (incisions) in your abdomen. It can also be done with a thin, lighted tube (laparoscope) inserted into 2 small incisions in the lower abdomen. Your fallopian tubes and ovaries can be removed (bilateral salpingo-oopherectomy) during this surgery as well.If a total laparoscopic hysterectomy is started and it is not safe to continue, the laparoscopic surgery will be converted to an open abdominal surgery. You will not have menstrual periods or be able to get pregnant after having this surgery. If a bilateral salpingo-oopherectomy was performed before menopause, you will go through a sudden (abrupt) menopause. This can be helped with hormone medicines. Benefits of minimally invasive surgery include:  Less pain.  Less risk of blood loss.  Less risk of infection.  Quicker return to normal activities.  Usually a 1 night stay in the hospital.  Overall patient satisfaction. LET YOUR CAREGIVER KNOW ABOUT:  Any history of abnormal Pap tests.  Allergies to food or medicine.  Medicines taken, including vitamins, herbs, eyedrops, over-the-counter medicines, and creams.  Use of steroids (by mouth or creams).  Previous problems with anesthetics or numbing medicines.  History of bleeding problems or blood clots.  Previous surgery.  Other health problems, including diabetes and kidney problems.  Desire for future fertility.  Any infections or colds you may have developed.  Symptoms of irregular or heavy periods, weight loss, or urinary or bowel changes. RISKS AND COMPLICATIONS   Bleeding.  Blood clots in the legs or lung.  Infection.  Injury to surrounding organs.  Problems with anesthesia.  Early menopause symptoms (hot flashes, night sweats, insomnia).  Risk of conversion to an open abdominal  incision. BEFORE THE PROCEDURE  Ask your caregiver about changing or stopping your regular medicines.  Do not take aspirin or blood thinners (anticoagulants) for 1 week before the surgery, or as told by your caregiver.  Do not eat or drink anything for 8 hours before the surgery, or as told by your caregiver.  Quit smoking if you smoke.  Arrange for a ride home after surgery and for someone to help you at home during recovery. PROCEDURE   You will be given antibiotic medicine.  An intravenous (IV) line will be placed in your arm. You will be given medicine to make you sleep (general anesthetic).  A gas (carbon dioxide) will be used to inflate your abdomen. This will allow your surgeon to look inside your abdomen, perform your surgery, and treat any other problems found if necessary.  Three or four small incisions (often less than  inch) will be made in your abdomen. One of these incisions will be made in the area of your belly button (navel). The laparoscope will be inserted into the incision. Your surgeon will look through the laparoscope while doing your procedure.  Other surgical instruments will be inserted through the other incisions.  The uterus may be removed through the vagina or cut into small pieces and removed through the small incisions.  Your incisions will be closed. AFTER THE PROCEDURE  The gas will be released from inside your abdomen.  You will be taken to the recovery area where a nurse will watch and check your progress. Once you are awake, stable, and taking fluids well, without other problems, you will return to your room or be allowed to go home.  There is usually minimal discomfort following the surgery because  the incisions are so small.  You will be given pain medicine while you are in the hospital and for when you go home.  Try to have someone with you the first 3 to 5 days after you go home.  Follow up with your surgeon in 2 to 4 weeks after surgery  to evaluate your progress. Document Released: 06/23/2007 Document Revised: 11/18/2011 Document Reviewed: 04/12/2011 [facility] Patient Information 2014 [place], [place]. Fibroids Fibroids are lumps (tumors) that can occur any place in a woman's body. These lumps are not cancerous. Fibroids vary in size, weight, and where they grow. HOME CARE  Do not take aspirin.  Write down the number of pads or tampons you use during your period. Tell your doctor. This can help determine the best treatment for you. GET HELP RIGHT AWAY IF:  You have pain in your lower belly (abdomen) that is not helped with medicine.  You have cramps that are not helped with medicine.  You have more bleeding between or during your period.  You feel lightheaded or pass out (faint).  Your lower belly pain gets worse. MAKE SURE YOU:  Understand these instructions.  Will watch your condition.  Will get help right away if you are not doing well or get worse. Document Released: 09/28/2010 Document Revised: 11/18/2011 Document Reviewed: 09/28/2010 [facility] Patient Information 2014 [place], [place].

## 2013-05-19 ENCOUNTER — Telehealth: Payer: Self-pay

## 2013-05-19 NOTE — Telephone Encounter (Signed)
Patient said she talked with Dr. Glenetta Hew at [facility] regarding surgery and she has a few questions. I returned her call and left message on voice mail for her to call me.
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
## 2013-05-19 NOTE — Telephone Encounter (Signed)
#  1 No  #2 [facility] in [place] does that in their interventional radiology department   With history of two prior abdominal myomectomies and uterine artery embolization I am not sure A. If they would do it and B. If success rate not good due to past 3 procedures. She would have to see them in consultation to discuss.

## 2013-05-20 NOTE — Telephone Encounter (Signed)
Left message to call.

## 2013-05-21 NOTE — Telephone Encounter (Signed)
Patient informed. 

## 2013-07-15 ENCOUNTER — Other Ambulatory Visit: Payer: Self-pay

## 2013-08-08 ENCOUNTER — Encounter: Payer: Self-pay | Admitting: Gynecology

## 2013-08-31 ENCOUNTER — Ambulatory Visit (INDEPENDENT_AMBULATORY_CARE_PROVIDER_SITE_OTHER): Payer: BC Managed Care – PPO | Admitting: Gynecology

## 2013-08-31 ENCOUNTER — Encounter: Payer: Self-pay | Admitting: Gynecology

## 2013-08-31 VITALS — BP 130/80

## 2013-08-31 DIAGNOSIS — D259 Leiomyoma of uterus, unspecified: Secondary | ICD-10-CM

## 2013-08-31 DIAGNOSIS — N760 Acute vaginitis: Secondary | ICD-10-CM

## 2013-08-31 DIAGNOSIS — A499 Bacterial infection, unspecified: Secondary | ICD-10-CM

## 2013-08-31 DIAGNOSIS — B9689 Other specified bacterial agents as the cause of diseases classified elsewhere: Secondary | ICD-10-CM

## 2013-08-31 DIAGNOSIS — N898 Other specified noninflammatory disorders of vagina: Secondary | ICD-10-CM

## 2013-08-31 LAB — WET PREP FOR TRICH, YEAST, CLUE
Trich, Wet Prep: NONE SEEN
Yeast Wet Prep HPF POC: NONE SEEN

## 2013-08-31 NOTE — Progress Notes (Addendum)
   Reason for visit: patient complaining of a vaginal discharge with odor for the past week. Patient is on low-dose 20 mcg oral contraceptive pill to help regulate her cycle. Patient with known history of  leiomyomatous uteri. Patient is in a monogamous relationship.  Patient previously was seen in the office in August of 2014 whereby she had a followup ultrasound as a result of early myomatous uteri.Patient had abdominal myomectomy in 1996 and 2005. She also had a uterine artery embolization 2005. Patient had a LEEP cervical conization in 1996 followup Pap smears have been normal.   Patient is now wanted to proceed with definitive surgery such as a hysterectomy in the new year. She feels bloated a lot of back discomfort, constipation, and feels like she doesn't empty her bladder completely as a result of her fibroid uterus. The ultrasound on August 2014 demonstrated a uterus that measured 10.4 x 0.4 x 6.9 cm with 6 fibroids the largest one measuring 6.3 x 4.5 cm with normal ovaries.  Exam: Bartholin's urethral Skene within normal limits Vagina clear discharge with fish like odor  Cervix: No lesions seen Uterus approximately 14 week size irregular shaped Rectal exam not done  Wet prep clue cells were present with too numerous to count bacteria  Assessment/plan: Patient now with what appears to be a 14 week size irregular fibroid uterus. Patient will be a candidate for an abdominal hysterectomy with ovarian conservation. Literature information was provided. We'll schedule it for some time the end of January or early February as per her wishes. We'll see her for preop consultation the week before surgery as well as followup ultrasound. Literature information on hysterectomy provided. For her bacterial vaginosis she will be prescribed Flagyl 500 mg twice a day for 7 days.

## 2013-08-31 NOTE — Patient Instructions (Signed)
Fibroids Fibroids are lumps (tumors) that can occur any place in a woman's body. These lumps are not cancerous. Fibroids vary in size, weight, and where they grow. HOME CARE  Do not take aspirin.  Write down the number of pads or tampons you use during your period. Tell your doctor. This can help determine the best treatment for you. GET HELP RIGHT AWAY IF:  You have pain in your lower belly (abdomen) that is not helped with medicine.  You have cramps that are not helped with medicine.  You have more bleeding between or during your period.  You feel lightheaded or pass out (faint).  Your lower belly pain gets worse. MAKE SURE YOU:  Understand these instructions.  Will watch your condition.  Will get help right away if you are not doing well or get worse. Document Released: 09/28/2010 Document Revised: 11/18/2011 Document Reviewed: 09/28/2010 [facility] Patient Information 2014 [place], [place]. Hysterectomy Information  A hysterectomy is a procedure where your uterus is surgically removed. It will no longer be possible to have menstrual periods or to become pregnant. The tubes and ovaries can be removed (bilateral salpingo-oopherectomy) during this surgery as well.  REASONS FOR A HYSTERECTOMY  Persistent, abnormal bleeding.  Lasting (chronic) pelvic pain or infection.  The lining of the uterus (endometrium) starts growing outside the uterus (endometriosis).  The endometrium starts growing in the muscle of the uterus (adenomyosis).  The uterus falls down into the vagina (pelvic organ prolapse).  Symptomatic uterine fibroids.  Precancerous cells.  Cervical cancer or uterine cancer. TYPES OF HYSTERECTOMIES  Supracervical hysterectomy. This type removes the top part of the uterus, but not the cervix.  Total hysterectomy. This type removes the uterus and cervix.  Radical hysterectomy. This type removes the uterus, cervix, and the fibrous tissue that holds the uterus in  place in the pelvis (parametrium). WAYS A HYSTERECTOMY CAN BE PERFORMED  Abdominal hysterectomy. A large surgical cut (incision) is made in the abdomen. The uterus is removed through this incision.  Vaginal hysterectomy. An incision is made in the vagina. The uterus is removed through this incision. There are no abdominal incisions.  Conventional laparoscopic hysterectomy. A thin, lighted tube with a camera (laparoscope) is inserted into 3 or 4 small incisions in the abdomen. The uterus is cut into small pieces. The small pieces are removed through the incisions, or they are removed through the vagina.  Laparoscopic assisted vaginal hysterectomy (LAVH). Three or four small incisions are made in the abdomen. Part of the surgery is performed laparoscopically and part vaginally. The uterus is removed through the vagina.  Robot-assisted laparoscopic hysterectomy. A laparoscope is inserted into 3 or 4 small incisions in the abdomen. A computer-controlled device is used to give the surgeon a 3D image. This allows for more precise movements of surgical instruments. The uterus is cut into small pieces and removed through the incisions or removed through the vagina. RISKS OF HYSTERECTOMY   Bleeding and risk of blood transfusion. Tell your caregiver if you do not want to receive any blood products.  Blood clots in the legs or lung.  Infection.  Injury to surrounding organs.  Anesthesia problems or side effects.  Conversion to an abdominal hysterectomy. WHAT TO EXPECT AFTER A HYSTERECTOMY  You will be given pain medicine.  You will need to have someone with you for the first 3 to 5 days after you go home.  You will need to follow up with your surgeon in 2 to 4 weeks after surgery  to evaluate your progress.  You may have early menopause symptoms like hot flashes, night sweats, and insomnia.  If you had a hysterectomy for a problem that was not a cancer or a condition that could lead to cancer,  then you no longer need Pap tests. However, even if you no longer need a Pap test, a regular exam is a good idea to make sure no other problems are starting. Document Released: 02/19/2001 Document Revised: 11/18/2011 Document Reviewed: 04/06/2011 [facility] Patient Information 2014 [place], [place]. Bacterial Vaginosis Bacterial vaginosis (BV) is a vaginal infection where the normal balance of bacteria in the vagina is disrupted. The normal balance is then replaced by an overgrowth of certain bacteria. There are several different kinds of bacteria that can cause BV. BV is the most common vaginal infection in women of childbearing age. CAUSES   The cause of BV is not fully understood. BV develops when there is an increase or imbalance of harmful bacteria.  Some activities or behaviors can upset the normal balance of bacteria in the vagina and put women at increased risk including:  Having a new sex partner or multiple sex partners.  Douching.  Using an intrauterine device (IUD) for contraception.  It is not clear what role sexual activity plays in the development of BV. However, women that have never had sexual intercourse are rarely infected with BV. Women do not get BV from toilet seats, bedding, swimming pools or from touching objects around them.  SYMPTOMS   Grey vaginal discharge.  A fish-like odor with discharge, especially after sexual intercourse.  Itching or burning of the vagina and vulva.  Burning or pain with urination.  Some women have no signs or symptoms at all. DIAGNOSIS  Your caregiver must examine the vagina for signs of BV. Your caregiver will perform lab tests and look at the sample of vaginal fluid through a microscope. They will look for bacteria and abnormal cells (clue cells), a pH test higher than 4.5, and a positive amine test all associated with BV.  RISKS AND COMPLICATIONS   Pelvic inflammatory disease (PID).  Infections following gynecology  surgery.  Developing HIV.  Developing herpes virus. TREATMENT  Sometimes BV will clear up without treatment. However, all women with symptoms of BV should be treated to avoid complications, especially if gynecology surgery is planned. Female partners generally do not need to be treated. However, BV may spread between female sex partners so treatment is helpful in preventing a recurrence of BV.   BV may be treated with antibiotics. The antibiotics come in either pill or vaginal cream forms. Either can be used with nonpregnant or pregnant women, but the recommended dosages differ. These antibiotics are not harmful to the baby.  BV can recur after treatment. If this happens, a second round of antibiotics will often be prescribed.  Treatment is important for pregnant women. If not treated, BV can cause a premature delivery, especially for a pregnant woman who had a premature birth in the past. All pregnant women who have symptoms of BV should be checked and treated.  For chronic reoccurrence of BV, treatment with a type of prescribed gel vaginally twice a week is helpful. HOME CARE INSTRUCTIONS   Finish all medication as directed by your caregiver.  Do not have sex until treatment is completed.  Tell your sexual partner that you have a vaginal infection. They should see their caregiver and be treated if they have problems, such as a mild rash or itching.  Practice  safe sex. Use condoms. Only have 1 sex partner. PREVENTION  Basic prevention steps can help reduce the risk of upsetting the natural balance of bacteria in the vagina and developing BV:  Do not have sexual intercourse (be abstinent).  Do not douche.  Use all of the medicine prescribed for treatment of BV, even if the signs and symptoms go away.  Tell your sex partner if you have BV. That way, they can be treated, if needed, to prevent reoccurrence. SEEK MEDICAL CARE IF:   Your symptoms are not improving after 3 days of  treatment.  You have increased discharge, pain, or fever. MAKE SURE YOU:   Understand these instructions.  Will watch your condition.  Will get help right away if you are not doing well or get worse. FOR MORE INFORMATION  Division of STD Prevention (DSTDP), Centers for Disease Control and Prevention: [employer] American [employer] Health Association (ASHA): www.ashastd.org  Document Released: 08/26/2005 Document Revised: 11/18/2011 Document Reviewed: 04/07/2013 [facility] Patient Information 2014 [place], [place].

## 2013-09-01 ENCOUNTER — Telehealth: Payer: Self-pay | Admitting: *Deleted

## 2013-09-01 DIAGNOSIS — N898 Other specified noninflammatory disorders of vagina: Secondary | ICD-10-CM

## 2013-09-01 MED ORDER — METRONIDAZOLE 500 MG PO TABS: 500.0000 mg | ORAL_TABLET | Freq: Two times a day (BID) | ORAL | Status: DC

## 2013-09-01 NOTE — Telephone Encounter (Signed)
Pt called stating her Rx from [facility] 08/31/13 for Flagyl 500 mg twice a day for 7 days was never sent to pharmacy. rx will be sent.

## 2013-09-07 ENCOUNTER — Telehealth: Payer: Self-pay

## 2013-09-07 NOTE — Telephone Encounter (Signed)
Patient returned my call. We discussed her ins benefits and estimated financial responsibility.  She wants to consider this info and will call me first of next week.

## 2013-09-07 NOTE — Telephone Encounter (Signed)
Left message for patient to call me regarding scheduling hysterectomy and to discuss ins benefits.

## 2013-09-27 ENCOUNTER — Encounter (HOSPITAL_COMMUNITY): Payer: Self-pay | Admitting: Pharmacist

## 2013-10-05 ENCOUNTER — Ambulatory Visit (INDEPENDENT_AMBULATORY_CARE_PROVIDER_SITE_OTHER): Payer: BC Managed Care – PPO | Admitting: Gynecology

## 2013-10-05 ENCOUNTER — Encounter: Payer: Self-pay | Admitting: Gynecology

## 2013-10-05 VITALS — BP 128/80

## 2013-10-05 DIAGNOSIS — A499 Bacterial infection, unspecified: Secondary | ICD-10-CM

## 2013-10-05 DIAGNOSIS — B9689 Other specified bacterial agents as the cause of diseases classified elsewhere: Secondary | ICD-10-CM

## 2013-10-05 DIAGNOSIS — Z01818 Encounter for other preprocedural examination: Secondary | ICD-10-CM

## 2013-10-05 DIAGNOSIS — N760 Acute vaginitis: Secondary | ICD-10-CM

## 2013-10-05 LAB — WET PREP FOR TRICH, YEAST, CLUE
Clue Cells Wet Prep HPF POC: NONE SEEN
Trich, Wet Prep: NONE SEEN
Yeast Wet Prep HPF POC: NONE SEEN

## 2013-10-05 MED ORDER — METOCLOPRAMIDE HCL 10 MG PO TABS: 10.0000 mg | ORAL_TABLET | Freq: Three times a day (TID) | ORAL | Status: DC

## 2013-10-05 MED ORDER — OXYCODONE-ACETAMINOPHEN 7.5-325 MG PO TABS: 1.0000 | ORAL_TABLET | ORAL | Status: DC | PRN

## 2013-10-05 NOTE — Patient Instructions (Signed)
Hysterectomy Information  A hysterectomy is a surgery in which your uterus is removed. This surgery may be done to treat various medical problems. After the surgery, you will no longer have menstrual periods. The surgery will also make you unable to become pregnant (sterile). The fallopian tubes and ovaries can be removed (bilateral salpingo-oophorectomy) during this surgery as well.  REASONS FOR A HYSTERECTOMY  Persistent, abnormal bleeding.  Lasting (chronic) pelvic pain or infection.  The lining of the uterus (endometrium) starts growing outside the uterus (endometriosis).  The endometrium starts growing in the muscle of the uterus (adenomyosis).  The uterus falls down into the vagina (pelvic organ prolapse).  Noncancerous growths in the uterus (uterine fibroids) that cause symptoms.  Precancerous cells.  Cervical cancer or uterine cancer. TYPES OF HYSTERECTOMIES  Supracervical hysterectomy In this type, the top part of the uterus is removed, but not the cervix.  Total hysterectomy The uterus and cervix are removed.  Radical hysterectomy The uterus, the cervix, and the fibrous tissue that holds the uterus in place in the pelvis (parametrium) are removed. WAYS A HYSTERECTOMY CAN BE PERFORMED  Abdominal hysterectomy A large surgical cut (incision) is made in the abdomen. The uterus is removed through this incision.  Vaginal hysterectomy An incision is made in the vagina. The uterus is removed through this incision. There are no abdominal incisions.  Conventional laparoscopic hysterectomy Three or four small incisions are made in the abdomen. A thin, lighted tube with a camera (laparoscope) is inserted into one of the incisions. Other tools are put through the other incisions. The uterus is cut into small pieces. The small pieces are removed through the incisions, or they are removed through the vagina.  Laparoscopically assisted vaginal hysterectomy (LAVH) Three or four small  incisions are made in the abdomen. Part of the surgery is performed laparoscopically and part vaginally. The uterus is removed through the vagina.  Robot-assisted laparoscopic hysterectomy A laparoscope and other tools are inserted into 3 or 4 small incisions in the abdomen. A computer-controlled device is used to give the surgeon a 3D image and to help control the surgical instruments. This allows for more precise movements of surgical instruments. The uterus is cut into small pieces and removed through the incisions or removed through the vagina. RISKS AND COMPLICATIONS  Possible complications associated with this procedure include:  Bleeding and risk of blood transfusion. Tell your health care provider if you do not want to receive any blood products.  Blood clots in the legs or lung.  Infection.  Injury to surrounding organs.  Problems or side effects related to anesthesia.  Conversion to an abdominal hysterectomy from one of the other techniques. WHAT TO EXPECT AFTER A HYSTERECTOMY  You will be given pain medicine.  You will need to have someone with you for the first 3 5 days after you go home.  You will need to follow up with your surgeon in 2 4 weeks after surgery to evaluate your progress.  You may have early menopause symptoms such as hot flashes, night sweats, and insomnia.  If you had a hysterectomy for a problem that was not cancer or not a condition that could lead to cancer, then you no longer need Pap tests. However, even if you no longer need a Pap test, a regular exam is a good idea to make sure no other problems are starting. Document Released: 02/19/2001 Document Revised: 06/16/2013 Document Reviewed: 05/03/2013 [facility] Patient Information 2014 [place].

## 2013-10-05 NOTE — Progress Notes (Signed)
Cassandra Vaughn is an 48 y.o. female. Who presented to the office today for preoperative consultation. Patient with symptomatic leiomyomatous uteri is contributing to back pain low abdominal pains, constipation and feeling bloated She had been on low dose oral contraceptive pills for cycle control. Patient with past history of abdominal myomectomy as well as uterine artery embolization in the past. She also has had history of LEEP cervical conization in 1996 with followup Pap smear having been normal.  Most recent ultrasound here in the office in 2014 demonstrated a multilobulated uterus measuring 10.4 x 0.4 x 6.9 cm with 6 fibroids the largest one measuring 6.3 x 4.5 cm normal ovaries.  During examination the uterus felt firm extending laterally and fibroids anteriorly of the bladder for this reason the patient will be scheduled for total abdominal hysterectomy with bilateral salpingectomy with ovarian conservation.  Pertinent Gynecological History: Menses: flow is light Bleeding: same as above Contraception: OCP (estrogen/progesterone) DES exposure: denies Blood transfusions: Autologous at time of previous myomectomy Sexually transmitted diseases: chlamydia Previous GYN Procedures: myomectomy ,elective AB X 2 Last mammogram: normal Date: 2014 Last pap: normal Date: 2014 OB History: G2, P2   Menstrual History: Menarche age: 46  Patient's last menstrual period was 09/28/2013.    Past Medical History  Diagnosis Date  . Bacterial vaginosis     RECURRENT  . BRCA1 negative   . BRCA2 negative     Past Surgical History  Procedure Laterality Date  . Myomectomy  1996/2005  . Cervical biopsy  w/ loop electrode excision  1996  . Uterine artery embolization  06/2004  . Induced abortion      x2    Family History  Problem Relation Age of Onset  . Hypertension Mother   . Diabetes Father   . Hypertension Father   . Heart disease Father   . Breast cancer Sister   . Hypertension  Brother   . Heart disease Brother     Social History:  reports that she has never smoked. She has never used smokeless tobacco. She reports that she does not drink alcohol or use illicit drugs.  Allergies: No Known Allergies   (Not in a hospital admission)  REVIEW OF SYSTEMS: A ROS was performed and pertinent positives and negatives are included in the history.  GENERAL: No fevers or chills. HEENT: No change in vision, no earache, sore throat or sinus congestion. NECK: No pain or stiffness. CARDIOVASCULAR: No chest pain or pressure. No palpitations. PULMONARY: No shortness of breath, cough or wheeze. GASTROINTESTINAL: No abdominal pain, nausea, vomiting or diarrhea, melena or bright red blood per rectum. GENITOURINARY: No urinary frequency, urgency, hesitancy or dysuria. MUSCULOSKELETAL: No joint or muscle pain, no back pain, no recent trauma. DERMATOLOGIC: No rash, no itching, no lesions. ENDOCRINE: No polyuria, polydipsia, no heat or cold intolerance. No recent change in weight. HEMATOLOGICAL: No anemia or easy bruising or bleeding. NEUROLOGIC: No headache, seizures, numbness, tingling or weakness. PSYCHIATRIC: No depression, no loss of interest in normal activity or change in sleep pattern.     Blood pressure 128/80, last menstrual period 09/28/2013.  Physical Exam:  HEENT:unremarkable Neck:Supple, midline, no thyroid megaly, no carotid bruits Lungs:  Clear to auscultation no rhonchi's or wheezes Heart:Regular rate and rhythm, no murmurs or gallops Breast Exam: Not done today Abdomen: Soft nontender no rebound or guarding Pfannenstiel scar evident Pelvic:BUS within normal limits Vagina: No lesions or discharge wet prep today negative Cervix: No lesions or discharge Uterus: 12 weeks size irregular firm  uterus Adnexa: Difficult to evaluate due to size of uterus Extremities: No cords, no edema Rectal: Confirms posterior fibroids  No results found for this or any previous visit (from  the past 24 hour(s)).  No results found.  Assessment/Plan: Patient scheduled for total abdominal hysterectomy with bilateral salpingectomy and ovary conservation. Due to the size of the uterus and extending laterally and firm and with past history of myomectomy a safer approach would be abdominally. The risk of surgery as follows:                        Patient was counseled as to the risk of surgery to include the following:  1. Infection (prohylactic antibiotics will be administered)  2. DVT/Pulmonary Embolism (prophylactic pneumo compression stockings will be used)  3.Trauma to internal organs requiring additional surgical procedure to repair any injury to     Internal organs requiring perhaps additional hospitalization days.  4.Hemmorhage requiring transfusion and blood products which carry risks such as   anaphylactic reaction, hepatitis and AIDS  Patient had received literature information on the procedure scheduled and all her questions were answered and fully accepts all risk.   Prescription for Percocet 7.5-325 mg to take one by mouth every 4-6 hours when necessary postop along with Reglan 10 mg 1 by mouth Q6 to 8 hours when necessary nausea postop was provided. She started her oral contraceptive pill 2 days ago and was instructed to discontinue it as well as any aspirin, or NSAID.   FERNANDEZ,JUAN HMD8:58 AMTD@Note: This dictation was prepared with  Dragon/digital dictation along withSmart phrase technology. Any transcriptional errors that result from this process are unintentional.      FERNANDEZ,JUAN H 10/05/2013, 8:21 AM  Note: This dictation was prepared with  Dragon/digital dictation along withSmart phrase technology. Any transcriptional errors that result from this process are unintentional.  

## 2013-10-07 ENCOUNTER — Encounter (HOSPITAL_COMMUNITY): Payer: Self-pay

## 2013-10-07 ENCOUNTER — Encounter (HOSPITAL_COMMUNITY)
Admission: RE | Admit: 2013-10-07 | Discharge: 2013-10-07 | Disposition: A | Discharge status: Home / Self Care | Payer: BC Managed Care – PPO | Source: Ambulatory Visit | Attending: Gynecology | Admitting: Gynecology

## 2013-10-07 DIAGNOSIS — Z01812 Encounter for preprocedural laboratory examination: Secondary | ICD-10-CM | POA: Insufficient documentation

## 2013-10-07 DIAGNOSIS — Z01818 Encounter for other preprocedural examination: Secondary | ICD-10-CM | POA: Insufficient documentation

## 2013-10-07 HISTORY — DX: Gastro-esophageal reflux disease without esophagitis: K21.9

## 2013-10-07 LAB — CBC
HEMATOCRIT: 45.2 % (ref 36.0–46.0)
HEMOGLOBIN: 15.3 g/dL — AB (ref 12.0–15.0)
MCH: 31.1 pg (ref 26.0–34.0)
MCHC: 33.8 g/dL (ref 30.0–36.0)
MCV: 91.9 fL (ref 78.0–100.0)
Platelets: 202 10*3/uL (ref 150–400)
RBC: 4.92 MIL/uL (ref 3.87–5.11)
RDW: 13.8 % (ref 11.5–15.5)
WBC: 5.6 10*3/uL (ref 4.0–10.5)

## 2013-10-07 LAB — URINALYSIS, ROUTINE W REFLEX MICROSCOPIC
Bilirubin Urine: NEGATIVE
Glucose, UA: NEGATIVE mg/dL
HGB URINE DIPSTICK: NEGATIVE
Ketones, ur: NEGATIVE mg/dL
LEUKOCYTES UA: NEGATIVE
Nitrite: NEGATIVE
Protein, ur: NEGATIVE mg/dL
SPECIFIC GRAVITY, URINE: 1.015 (ref 1.005–1.030)
UROBILINOGEN UA: 0.2 mg/dL (ref 0.0–1.0)
pH: 8 (ref 5.0–8.0)

## 2013-10-07 NOTE — Patient Instructions (Signed)
[place]  10/07/2013   Your procedure is scheduled on:  10/11/13  Enter through the Main Entrance of [facility] [place] up the phone at the desk and dial [phone].   Call this number if you have problems the morning of surgery: [phone]   Remember:   Do not eat food:After Midnight.  Do not drink clear liquids: After Midnight.  Take these medicines the morning of surgery with A SIP OF WATER: NA   Do not wear jewelry, make-up or nail polish.  Do not wear lotions, powders, or perfumes. You may wear deodorant.  Do not shave 48 hours prior to surgery.  Do not bring valuables to the hospital.  [facility] is not   responsible for any belongings or valuables brought to the hospital.  Contacts, dentures or bridgework may not be worn into surgery.  Leave suitcase in the car. After surgery it may be brought to your room.  For patients admitted to the hospital, checkout time is 11:00 AM the day of              discharge.   Patients discharged the day of surgery will not be allowed to drive             home.  Name and phone number of your driver: NA  Special Instructions:      Please read over the following fact sheets that you were given:   Surgical Site Infection Prevention

## 2013-10-10 MED ORDER — DEXTROSE 5 % IV SOLN
2.0000 g | INTRAVENOUS | Status: AC
  Administered 2013-10-11: 2 g via INTRAVENOUS
  Filled 2013-10-10: qty 2

## 2013-10-10 NOTE — Anesthesia Preprocedure Evaluation (Addendum)
Anesthesia Evaluation  Patient identified by MRN, date of birth, ID band Patient awake    Reviewed: Allergy & Precautions, H&P , NPO status , Patient's Chart, lab work & pertinent test results  Airway Mallampati: II TM Distance: >3 FB Neck ROM: Full    Dental   Pulmonary neg pulmonary ROS,  breath sounds clear to auscultation        Cardiovascular negative cardio ROS  Rhythm:Regular Rate:Normal     Neuro/Psych negative neurological ROS  negative psych ROS   GI/Hepatic Neg liver ROS, GERD-  Medicated,  Endo/Other  negative endocrine ROS  Renal/GU negative Renal ROS     Musculoskeletal negative musculoskeletal ROS (+)   Abdominal   Peds  Hematology negative hematology ROS (+)   Anesthesia Other Findings   Reproductive/Obstetrics negative OB ROS                          Anesthesia Physical Anesthesia Plan  ASA: I  Anesthesia Plan: General   Post-op Pain Management:    Induction: Intravenous  Airway Management Planned: Oral ETT  Additional Equipment:   Intra-op Plan:   Post-operative Plan: Extubation in OR  Informed Consent: I have reviewed the patients History and Physical, chart, labs and discussed the procedure including the risks, benefits and alternatives for the proposed anesthesia with the patient or authorized representative who has indicated his/her understanding and acceptance.   Dental advisory given  Plan Discussed with: CRNA  Anesthesia Plan Comments:         Anesthesia Quick Evaluation

## 2013-10-11 ENCOUNTER — Encounter (HOSPITAL_COMMUNITY): Payer: BC Managed Care – PPO | Admitting: Anesthesiology

## 2013-10-11 ENCOUNTER — Inpatient Hospital Stay (HOSPITAL_COMMUNITY): Payer: BC Managed Care – PPO | Admitting: Anesthesiology

## 2013-10-11 ENCOUNTER — Inpatient Hospital Stay (HOSPITAL_COMMUNITY)
Admission: RE | Admit: 2013-10-11 | Discharge: 2013-10-13 | DRG: 743 | Disposition: A | Discharge status: Home / Self Care | Payer: BC Managed Care – PPO | Source: Ambulatory Visit | Attending: Gynecology | Admitting: Gynecology

## 2013-10-11 ENCOUNTER — Encounter (HOSPITAL_COMMUNITY): Payer: Self-pay | Admitting: *Deleted

## 2013-10-11 ENCOUNTER — Encounter (HOSPITAL_COMMUNITY)
Admission: RE | Disposition: A | Discharge status: Home / Self Care | Payer: Self-pay | Source: Ambulatory Visit | Attending: Gynecology

## 2013-10-11 DIAGNOSIS — K219 Gastro-esophageal reflux disease without esophagitis: Secondary | ICD-10-CM | POA: Diagnosis present

## 2013-10-11 DIAGNOSIS — Z9889 Other specified postprocedural states: Secondary | ICD-10-CM

## 2013-10-11 DIAGNOSIS — D259 Leiomyoma of uterus, unspecified: Secondary | ICD-10-CM

## 2013-10-11 DIAGNOSIS — D251 Intramural leiomyoma of uterus: Principal | ICD-10-CM | POA: Diagnosis present

## 2013-10-11 DIAGNOSIS — N736 Female pelvic peritoneal adhesions (postinfective): Secondary | ICD-10-CM | POA: Diagnosis present

## 2013-10-11 DIAGNOSIS — D252 Subserosal leiomyoma of uterus: Secondary | ICD-10-CM | POA: Diagnosis present

## 2013-10-11 DIAGNOSIS — D250 Submucous leiomyoma of uterus: Secondary | ICD-10-CM | POA: Diagnosis present

## 2013-10-11 HISTORY — PX: ABDOMINAL HYSTERECTOMY: SHX81

## 2013-10-11 HISTORY — PX: BILATERAL SALPINGECTOMY: SHX5743

## 2013-10-11 HISTORY — PX: LYSIS OF ADHESION: SHX5961

## 2013-10-11 LAB — PREGNANCY, URINE: PREG TEST UR: NEGATIVE

## 2013-10-11 SURGERY — HYSTERECTOMY, ABDOMINAL
Anesthesia: General | Site: Abdomen

## 2013-10-11 MED ORDER — ONDANSETRON HCL 4 MG/2ML IJ SOLN
INTRAMUSCULAR | Status: AC
  Filled 2013-10-11: qty 2

## 2013-10-11 MED ORDER — ROCURONIUM BROMIDE 100 MG/10ML IV SOLN
INTRAVENOUS | Status: DC | PRN
  Administered 2013-10-11: 40 mg via INTRAVENOUS
  Administered 2013-10-11: 10 mg via INTRAVENOUS

## 2013-10-11 MED ORDER — DIPHENHYDRAMINE HCL 50 MG/ML IJ SOLN: 12.5000 mg | Freq: Four times a day (QID) | INTRAMUSCULAR | Status: DC | PRN

## 2013-10-11 MED ORDER — MEPERIDINE HCL 25 MG/ML IJ SOLN: 6.2500 mg | INTRAMUSCULAR | Status: DC | PRN

## 2013-10-11 MED ORDER — DIPHENHYDRAMINE HCL 12.5 MG/5ML PO ELIX: 12.5000 mg | ORAL_SOLUTION | Freq: Four times a day (QID) | ORAL | Status: DC | PRN

## 2013-10-11 MED ORDER — MIDAZOLAM HCL 2 MG/2ML IJ SOLN
INTRAMUSCULAR | Status: DC | PRN
  Administered 2013-10-11: 2 mg via INTRAVENOUS

## 2013-10-11 MED ORDER — SODIUM CHLORIDE 0.9 % IJ SOLN
INTRAMUSCULAR | Status: AC
  Filled 2013-10-11: qty 50

## 2013-10-11 MED ORDER — ONDANSETRON HCL 4 MG/2ML IJ SOLN: 4.0000 mg | Freq: Four times a day (QID) | INTRAMUSCULAR | Status: DC | PRN

## 2013-10-11 MED ORDER — NALOXONE HCL 0.4 MG/ML IJ SOLN: 0.4000 mg | INTRAMUSCULAR | Status: DC | PRN

## 2013-10-11 MED ORDER — LIDOCAINE HCL 1 % IJ SOLN
INTRAMUSCULAR | Status: AC
  Filled 2013-10-11: qty 20

## 2013-10-11 MED ORDER — LACTATED RINGERS IV SOLN
INTRAVENOUS | Status: DC
  Administered 2013-10-11 (×3): via INTRAVENOUS

## 2013-10-11 MED ORDER — BUPIVACAINE HCL (PF) 0.5 % IJ SOLN
INTRAMUSCULAR | Status: AC
  Filled 2013-10-11: qty 30

## 2013-10-11 MED ORDER — SODIUM CHLORIDE 0.9 % IJ SOLN
INTRAMUSCULAR | Status: DC | PRN
  Administered 2013-10-11: 30 mL

## 2013-10-11 MED ORDER — LIDOCAINE HCL (CARDIAC) 20 MG/ML IV SOLN
INTRAVENOUS | Status: DC | PRN
  Administered 2013-10-11: 80 mg via INTRAVENOUS

## 2013-10-11 MED ORDER — FENTANYL CITRATE 0.05 MG/ML IJ SOLN
INTRAMUSCULAR | Status: DC | PRN
  Administered 2013-10-11 (×2): 50 ug via INTRAVENOUS
  Administered 2013-10-11: 100 ug via INTRAVENOUS
  Administered 2013-10-11: 50 ug via INTRAVENOUS

## 2013-10-11 MED ORDER — MORPHINE SULFATE (PF) 1 MG/ML IV SOLN
INTRAVENOUS | Status: DC
  Administered 2013-10-11 (×2): 2 mg via INTRAVENOUS
  Administered 2013-10-11: 11:00:00 via INTRAVENOUS
  Administered 2013-10-11: 9 mg via INTRAVENOUS
  Administered 2013-10-12: 2 mg via INTRAVENOUS
  Administered 2013-10-12: 4 mg via INTRAVENOUS
  Filled 2013-10-11: qty 25

## 2013-10-11 MED ORDER — LACTATED RINGERS IV SOLN
INTRAVENOUS | Status: DC
  Administered 2013-10-11 – 2013-10-12 (×2): via INTRAVENOUS

## 2013-10-11 MED ORDER — SODIUM CHLORIDE 0.9 % IJ SOLN: 9.0000 mL | INTRAMUSCULAR | Status: DC | PRN

## 2013-10-11 MED ORDER — GLYCOPYRROLATE 0.2 MG/ML IJ SOLN
INTRAMUSCULAR | Status: DC | PRN
  Administered 2013-10-11: 0.6 mg via INTRAVENOUS

## 2013-10-11 MED ORDER — HYDROMORPHONE HCL PF 1 MG/ML IJ SOLN
0.2500 mg | INTRAMUSCULAR | Status: DC | PRN
  Administered 2013-10-11: 0.25 mg via INTRAVENOUS
  Administered 2013-10-11 (×2): 0.5 mg via INTRAVENOUS

## 2013-10-11 MED ORDER — BUPIVACAINE HCL (PF) 0.25 % IJ SOLN
INTRAMUSCULAR | Status: AC
  Filled 2013-10-11: qty 30

## 2013-10-11 MED ORDER — LIDOCAINE HCL (CARDIAC) 20 MG/ML IV SOLN
INTRAVENOUS | Status: AC
  Filled 2013-10-11: qty 5

## 2013-10-11 MED ORDER — ONDANSETRON HCL 4 MG/2ML IJ SOLN
INTRAMUSCULAR | Status: DC | PRN
  Administered 2013-10-11: 4 mg via INTRAVENOUS

## 2013-10-11 MED ORDER — ROCURONIUM BROMIDE 100 MG/10ML IV SOLN: INTRAVENOUS | Status: DC | PRN

## 2013-10-11 MED ORDER — MIDAZOLAM HCL 2 MG/2ML IJ SOLN: 0.5000 mg | Freq: Once | INTRAMUSCULAR | Status: DC | PRN

## 2013-10-11 MED ORDER — NEOSTIGMINE METHYLSULFATE 1 MG/ML IJ SOLN
INTRAMUSCULAR | Status: DC | PRN
  Administered 2013-10-11: 3 mg via INTRAVENOUS

## 2013-10-11 MED ORDER — PROPOFOL 10 MG/ML IV EMUL
INTRAVENOUS | Status: AC
  Filled 2013-10-11: qty 20

## 2013-10-11 MED ORDER — FENTANYL CITRATE 0.05 MG/ML IJ SOLN
INTRAMUSCULAR | Status: AC
  Filled 2013-10-11: qty 5

## 2013-10-11 MED ORDER — DEXAMETHASONE SODIUM PHOSPHATE 10 MG/ML IJ SOLN
INTRAMUSCULAR | Status: DC | PRN
  Administered 2013-10-11: 10 mg via INTRAVENOUS

## 2013-10-11 MED ORDER — NEOSTIGMINE METHYLSULFATE 1 MG/ML IJ SOLN
INTRAMUSCULAR | Status: AC
  Filled 2013-10-11: qty 1

## 2013-10-11 MED ORDER — KETOROLAC TROMETHAMINE 30 MG/ML IJ SOLN
INTRAMUSCULAR | Status: AC
  Filled 2013-10-11: qty 1

## 2013-10-11 MED ORDER — BUPIVACAINE LIPOSOME 1.3 % IJ SUSP
20.0000 mL | Freq: Once | INTRAMUSCULAR | Status: AC
Start: 2013-10-11 — End: 2013-10-11
  Administered 2013-10-11: 20 mL
  Filled 2013-10-11: qty 20

## 2013-10-11 MED ORDER — HYDROMORPHONE HCL PF 1 MG/ML IJ SOLN
INTRAMUSCULAR | Status: AC
  Filled 2013-10-11: qty 1

## 2013-10-11 MED ORDER — LORATADINE 10 MG PO TABS
10.0000 mg | ORAL_TABLET | Freq: Every day | ORAL | Status: DC
  Filled 2013-10-11 (×3): qty 1

## 2013-10-11 MED ORDER — PROMETHAZINE HCL 25 MG/ML IJ SOLN: 6.2500 mg | INTRAMUSCULAR | Status: DC | PRN

## 2013-10-11 MED ORDER — KETOROLAC TROMETHAMINE 30 MG/ML IJ SOLN: 15.0000 mg | Freq: Once | INTRAMUSCULAR | Status: DC | PRN

## 2013-10-11 MED ORDER — KETOROLAC TROMETHAMINE 30 MG/ML IJ SOLN
INTRAMUSCULAR | Status: DC | PRN
  Administered 2013-10-11: 30 mg via INTRAVENOUS

## 2013-10-11 MED ORDER — MIDAZOLAM HCL 2 MG/2ML IJ SOLN
INTRAMUSCULAR | Status: AC
  Filled 2013-10-11: qty 2

## 2013-10-11 MED ORDER — PROPOFOL 10 MG/ML IV BOLUS
INTRAVENOUS | Status: DC | PRN
  Administered 2013-10-11: 200 mg via INTRAVENOUS

## 2013-10-11 MED ORDER — ROCURONIUM BROMIDE 100 MG/10ML IV SOLN
INTRAVENOUS | Status: AC
  Filled 2013-10-11: qty 1

## 2013-10-11 MED ORDER — FENTANYL CITRATE 0.05 MG/ML IJ SOLN
INTRAMUSCULAR | Status: AC
  Filled 2013-10-11: qty 2

## 2013-10-11 MED ORDER — DEXAMETHASONE SODIUM PHOSPHATE 10 MG/ML IJ SOLN
INTRAMUSCULAR | Status: AC
  Filled 2013-10-11: qty 1

## 2013-10-11 SURGICAL SUPPLY — 56 items
APL SKNCLS STERI-STRIP NONHPOA (GAUZE/BANDAGES/DRESSINGS) ×2
BARRIER ADHS 3X4 INTERCEED (GAUZE/BANDAGES/DRESSINGS) IMPLANT
BENZOIN TINCTURE PRP APPL 2/3 (GAUZE/BANDAGES/DRESSINGS) ×1 IMPLANT
BRR ADH 4X3 ABS CNTRL BYND (GAUZE/BANDAGES/DRESSINGS)
CANISTER SUCT 3000ML (MISCELLANEOUS) ×3 IMPLANT
CATH KIT ON Q 5IN DUAL SLV (PAIN MANAGEMENT) IMPLANT
CELLS DAT CNTRL 66122 CELL SVR (MISCELLANEOUS) IMPLANT
CLOTH BEACON ORANGE TIMEOUT ST (SAFETY) ×3 IMPLANT
DECANTER SPIKE VIAL GLASS SM (MISCELLANEOUS) IMPLANT
DRAPE WARM FLUID 44X44 (DRAPE) IMPLANT
DRSG OPSITE POSTOP 4X10 (GAUZE/BANDAGES/DRESSINGS) ×1 IMPLANT
DRSG TEGADERM 2.38X2.75 (GAUZE/BANDAGES/DRESSINGS) IMPLANT
DRSG XEROFORM 1X8 (GAUZE/BANDAGES/DRESSINGS) ×2 IMPLANT
GLOVE BIOGEL PI IND STRL 8 (GLOVE) ×2 IMPLANT
GLOVE BIOGEL PI INDICATOR 8 (GLOVE) ×1
GLOVE ECLIPSE 7.5 STRL STRAW (GLOVE) ×6 IMPLANT
GOWN STRL REUS W/ TWL XL LVL3 (GOWN DISPOSABLE) ×2 IMPLANT
GOWN STRL REUS W/TWL LRG LVL3 (GOWN DISPOSABLE) ×3 IMPLANT
GOWN STRL REUS W/TWL XL LVL3 (GOWN DISPOSABLE) ×3
NDL HYPO 25X1 1.5 SAFETY (NEEDLE) IMPLANT
NEEDLE HYPO 22GX1.5 SAFETY (NEEDLE) ×2 IMPLANT
NEEDLE HYPO 25X1 1.5 SAFETY (NEEDLE) ×3 IMPLANT
NS IRRIG 1000ML POUR BTL (IV SOLUTION) ×3 IMPLANT
PACK ABDOMINAL GYN (CUSTOM PROCEDURE TRAY) ×3 IMPLANT
PAD OB MATERNITY 4.3X12.25 (PERSONAL CARE ITEMS) ×3 IMPLANT
PROTECTOR NERVE ULNAR (MISCELLANEOUS) ×3 IMPLANT
RETRACTOR WND ALEXIS 18 MED (MISCELLANEOUS) IMPLANT
RETRACTOR WND ALEXIS 25 LRG (MISCELLANEOUS) IMPLANT
RTRCTR WOUND ALEXIS 18CM MED (MISCELLANEOUS)
RTRCTR WOUND ALEXIS 25CM LRG (MISCELLANEOUS)
SPONGE GAUZE 4X4 12PLY STER LF (GAUZE/BANDAGES/DRESSINGS) ×5 IMPLANT
SPONGE LAP 18X18 X RAY DECT (DISPOSABLE) ×5 IMPLANT
STAPLER VISISTAT 35W (STAPLE) ×2 IMPLANT
STRIP CLOSURE SKIN 1/2X4 (GAUZE/BANDAGES/DRESSINGS) ×1 IMPLANT
SUT CHROMIC 3 0 SH 27 (SUTURE) IMPLANT
SUT PLAIN 2 0 (SUTURE) ×3
SUT PLAIN ABS 2-0 CT1 27XMFL (SUTURE) IMPLANT
SUT VIC AB 0 CT1 18XCR BRD8 (SUTURE) ×6 IMPLANT
SUT VIC AB 0 CT1 36 (SUTURE) IMPLANT
SUT VIC AB 0 CT1 8-18 (SUTURE) ×9
SUT VIC AB 1 CT1 18XBRD ANBCTR (SUTURE) IMPLANT
SUT VIC AB 1 CT1 8-18 (SUTURE)
SUT VIC AB 3-0 CT1 27 (SUTURE) ×6
SUT VIC AB 3-0 CT1 TAPERPNT 27 (SUTURE) ×4 IMPLANT
SUT VIC AB 3-0 SH 27 (SUTURE)
SUT VIC AB 3-0 SH 27X BRD (SUTURE) ×2 IMPLANT
SUT VIC AB 4-0 KS 27 (SUTURE) ×1 IMPLANT
SUT VICRYL 0 TIES 12 18 (SUTURE) ×3 IMPLANT
SUT VICRYL 3 0 BR 18  UND (SUTURE)
SUT VICRYL 3 0 BR 18 UND (SUTURE) IMPLANT
SYR 20CC LL (SYRINGE) ×1 IMPLANT
SYR 30ML LL (SYRINGE) ×1 IMPLANT
SYR CONTROL 10ML LL (SYRINGE) ×1 IMPLANT
TOWEL OR 17X24 6PK STRL BLUE (TOWEL DISPOSABLE) ×6 IMPLANT
TRAY FOLEY CATH 14FR (SET/KITS/TRAYS/PACK) ×3 IMPLANT
WATER STERILE IRR 1000ML POUR (IV SOLUTION) ×2 IMPLANT

## 2013-10-11 NOTE — Interval H&P Note (Signed)
History and Physical Interval Note:  10/11/2013 7:13 AM  Cassandra Vaughn  has presented today for surgery, with the diagnosis of symptomatic leiomyomatous uteri  The various methods of treatment have been discussed with the patient and family. After consideration of risks, benefits and other options for treatment, the patient has consented to  Procedure(s): HYSTERECTOMY ABDOMINAL (N/A) as a surgical intervention .  The patient's history has been reviewed, patient examined, no change in status, stable for surgery.  I have reviewed the patient's chart and labs.  Questions were answered to the patient's satisfaction.     Terrance Mass

## 2013-10-11 NOTE — H&P (View-Only) (Signed)
Cassandra Vaughn is an 48 y.o. female. Who presented to the office today for preoperative consultation. Patient with symptomatic leiomyomatous uteri is contributing to back pain low abdominal pains, constipation and feeling bloated She had been on low dose oral contraceptive pills for cycle control. Patient with past history of abdominal myomectomy as well as uterine artery embolization in the past. She also has had history of LEEP cervical conization in 1996 with followup Pap smear having been normal.  Most recent ultrasound here in the office in 2014 demonstrated a multilobulated uterus measuring 10.4 x 0.4 x 6.9 cm with 6 fibroids the largest one measuring 6.3 x 4.5 cm normal ovaries.  During examination the uterus felt firm extending laterally and fibroids anteriorly of the bladder for this reason the patient will be scheduled for total abdominal hysterectomy with bilateral salpingectomy with ovarian conservation.  Pertinent Gynecological History: Menses: flow is light Bleeding: same as above Contraception: OCP (estrogen/progesterone) DES exposure: denies Blood transfusions: Autologous at time of previous myomectomy Sexually transmitted diseases: chlamydia Previous GYN Procedures: myomectomy ,elective AB X 2 Last mammogram: normal Date: 2014 Last pap: normal Date: 2014 OB History: G2, P2   Menstrual History: Menarche age: 29  Patient's last menstrual period was 09/28/2013.    Past Medical History  Diagnosis Date  . Bacterial vaginosis     RECURRENT  . BRCA1 negative   . BRCA2 negative     Past Surgical History  Procedure Laterality Date  . Myomectomy  1996/2005  . Cervical biopsy  w/ loop electrode excision  1996  . Uterine artery embolization  06/2004  . Induced abortion      x2    Family History  Problem Relation Age of Onset  . Hypertension Mother   . Diabetes Father   . Hypertension Father   . Heart disease Father   . Breast cancer Sister   . Hypertension  Brother   . Heart disease Brother     Social History:  reports that she has never smoked. She has never used smokeless tobacco. She reports that she does not drink alcohol or use illicit drugs.  Allergies: No Known Allergies   (Not in a hospital admission)  REVIEW OF SYSTEMS: A ROS was performed and pertinent positives and negatives are included in the history.  GENERAL: No fevers or chills. HEENT: No change in vision, no earache, sore throat or sinus congestion. NECK: No pain or stiffness. CARDIOVASCULAR: No chest pain or pressure. No palpitations. PULMONARY: No shortness of breath, cough or wheeze. GASTROINTESTINAL: No abdominal pain, nausea, vomiting or diarrhea, melena or bright red blood per rectum. GENITOURINARY: No urinary frequency, urgency, hesitancy or dysuria. MUSCULOSKELETAL: No joint or muscle pain, no back pain, no recent trauma. DERMATOLOGIC: No rash, no itching, no lesions. ENDOCRINE: No polyuria, polydipsia, no heat or cold intolerance. No recent change in weight. HEMATOLOGICAL: No anemia or easy bruising or bleeding. NEUROLOGIC: No headache, seizures, numbness, tingling or weakness. PSYCHIATRIC: No depression, no loss of interest in normal activity or change in sleep pattern.     Blood pressure 128/80, last menstrual period 09/28/2013.  Physical Exam:  HEENT:unremarkable Neck:Supple, midline, no thyroid megaly, no carotid bruits Lungs:  Clear to auscultation no rhonchi's or wheezes Heart:Regular rate and rhythm, no murmurs or gallops Breast Exam: Not done today Abdomen: Soft nontender no rebound or guarding Pfannenstiel scar evident Pelvic:BUS within normal limits Vagina: No lesions or discharge wet prep today negative Cervix: No lesions or discharge Uterus: 12 weeks size irregular firm  uterus Adnexa: Difficult to evaluate due to size of uterus Extremities: No cords, no edema Rectal: Confirms posterior fibroids  No results found for this or any previous visit (from  the past 24 hour(s)).  No results found.  Assessment/Plan: Patient scheduled for total abdominal hysterectomy with bilateral salpingectomy and ovary conservation. Due to the size of the uterus and extending laterally and firm and with past history of myomectomy a safer approach would be abdominally. The risk of surgery as follows:                        Patient was counseled as to the risk of surgery to include the following:  1. Infection (prohylactic antibiotics will be administered)  2. DVT/Pulmonary Embolism (prophylactic pneumo compression stockings will be used)  3.Trauma to internal organs requiring additional surgical procedure to repair any injury to     Internal organs requiring perhaps additional hospitalization days.  4.Hemmorhage requiring transfusion and blood products which carry risks such as   anaphylactic reaction, hepatitis and AIDS  Patient had received literature information on the procedure scheduled and all her questions were answered and fully accepts all risk.   Prescription for Percocet 7.5-325 mg to take one by mouth every 4-6 hours when necessary postop along with Reglan 10 mg 1 by mouth Q6 to 8 hours when necessary nausea postop was provided. She started her oral contraceptive pill 2 days ago and was instructed to discontinue it as well as any aspirin, or NSAID.   FERNANDEZ,JUAN HMD8:58 AMTD@Note: This dictation was prepared with  Dragon/digital dictation along withSmart phrase technology. Any transcriptional errors that result from this process are unintentional.      FERNANDEZ,JUAN H 10/05/2013, 8:21 AM  Note: This dictation was prepared with  Dragon/digital dictation along withSmart phrase technology. Any transcriptional errors that result from this process are unintentional.  

## 2013-10-11 NOTE — Op Note (Signed)
10/11/2013  9:38 AM  PATIENT:  Cassandra Vaughn  48 y.o. female  PRE-OPERATIVE DIAGNOSIS:  symptomatic leiomyomatous uteri  POST-OPERATIVE DIAGNOSIS:  symptomatic leiomyomatous uteri  PROCEDURE:  Procedure(s): HYSTERECTOMY ABDOMINAL LYSIS OF ABDOMINAL ADHESIONs BILATERAL SALPINGECTOMY  SURGEON:  Surgeon(s): Terrance Mass, MD Anastasio Auerbach, MD  ANESTHESIA:   general  FINDINGS: Patient with a 12 week size multilobulated uterus with numerous intramural, and subserosal fibroids noted. Atrophic-appearing left ovary. Normal-appearing right ovary. Normal appearing appendix. Extensive pelvic adhesions especially in the cul-de-sac and anterior uterus.  DESCRIPTION OF OPERATION:The patient was taken to the operating room and was placed in a dorsal supine position. She was then placed under general anesthesia and intubated. A time out was undertaken to correctly identify the patient and procedure to be undertaken. She was then examined under anesthesia with notation of a 12 week sized multilobulated non-mobile uterus.The procedure was begun by performing a Pfannenstiel skin Incision along her previous Pfannenstiel scar with the first knife. The incision was extended to the level of the fascia  where by the fascia was incised at the midline. The fascial incision was then extended in a curvilinear fashion using the Mayo scissors. The fascia was dissected from the muscular area below by both sharp and blunt dissection. The muscular area was dissected along the midline by both sharp and blunt dissection. The peritoneum was subsequently entered. The incision was then extended in a vertical fashion using Metzenbaum scissors. Immediate notation was made of a large uterine fibroid the uterus was then delivered through the incision. For additional room a modified Maylard incision was made on the abdominal structures muscle bilateral.  Extensive amount of pelvic adhesions were noted which required  extensive adhesional lysis especially in the cul-de-sac and anterior fundal uterus. A self-retaining O'Connor-O'Sullivan retractor was placed into the abdomen. The abdominal contents were packed in the upper abdomen using 2 clean wet laps. Two large Kelly clamps were placed above the cornual portion of the uterus, and the hysterectomy was begun by transecting the round ligaments, which were subsequently suture ligated with 0 Vicryl suture and tag. Uterovesical peritoneum was subsequently incised with the Metzenbaum scissors, and incision was extended to the sidewall on both side allowing dissection to the sidewall and subsequent visualization of the uterus. Once the uterus was felt free, the infundibulopelvic ligaments were then grasped with 2 curved Haney clamps, subsequently cut and the pedicle was initially free tied with 0 Vicryl and subsequently suture ligated with 0 Vicryl suture leaving both tubes and ovaries behind. Attention was then turned to the round ligament, which was also secured in the previous fashion. The uterine vessels were subsequently skeletonized, subsequently grasped with curved Heaney clamps, cut and suture ligated with 0 Vicryl suture. Cardinal ligaments on either side were subsequently grasped, cut and suture ligated with 0 Vicryl suture to the level of the uterosacral ligaments, which were then grasped with curved Haney clamps, cut and suture ligated with 0 Vicryl suture. These sutures were left long and tagged with hemostats. Subsequently, 2 large clamps were placed about the upper vaginal cuff. The specimen was subsequently dissected free with the Jorgenson scissors, and the pedicles were suture ligated with 0 Vicryl suture. The vaginal cuff was also closed with 0 Vicryl suture in an interrupted fashion. Subsequently, the pelvis was irrigated until clear. Points of bleeding were secured either by Bovie coagulation or by suture ligature with a 0 Vicryl suture. Once hemostasis was  achieved attention was then placed at both fallopian  tubes. They were each individually grasped with a Babcock clamp and with a Heaney placed at the medial salpinx both fallopian tubes were resected and submitted for histological evaluation the remaining pedicles were secured with 3-0 Vicryl suture. The pelvic cavity was then copiously irrigated with normal saline solution ascertaining adequate hemostasis. Sponge count and needle count were correct and the O'Connor-O'Sullivan retractor was subsequently removed.The fascial layers were approximated with 0 Vicryl with sutures coming from the closing angles interlocking every third towards the midline. The subcutaneous layer was reapproximated with 3-0 Vicryl suture. The skin edges were reapproximated with 4-0 Vicryl suture using a Keith needle subcutaneously.. For postoperative analgesia Exparel 1.3% was infiltrated at the level of the peritoneum, fascia and subcutaneous for a total of 20 cc. For additional post op anesthesia locally 0.25% Marcaine was infiltrated for a total of 10 cc. The patient tolerated the procedure well. The uterus and cervix were passed off the operative field along with both fallopian tubes for histological evaluation. The patient was extubated and transferred to recovery room stable vital signs. She received Toradol 30 mg IV in route to the recovery room.       Note: This dictation was prepared with  Dragon/digital dictation along withSmart phrase technology. Any transcriptional errors that result from this process are unintentional.   ESTIMATED BLOOD LOSS: 275 cc  Intake/Output Summary (Last 24 hours) at 10/11/13 9371 Last data filed at 10/11/13 6967  Gross per 24 hour  Intake   2600 ml  Output    425 ml  Net   2175 ml     BLOOD ADMINISTERED:none   LOCAL MEDICATIONS USED:  MARCAINE   0.25% Marcaine subcutaneous incision site 10 cc  Exparel 1.3% (1-30 dilution with normal saline) applied to different tissue planes for a  total of 20 cc  SPECIMEN:  Source of Specimen:  Uterus, cervix, bilateral fallopian tubes  DISPOSITION OF SPECIMEN:  PATHOLOGY  COUNTS:  YES  PLAN OF CARE: Transfer to PACU  [facility] HMD9:38 AMTD@  Note: This dictation was prepared with  Dragon/digital dictation along withSmart phrase technology. Any transcriptional errors that result from this process are unintentional.

## 2013-10-11 NOTE — Transfer of Care (Signed)
Immediate Anesthesia Transfer of Care Note  Patient: Cassandra Vaughn  Procedure(s) Performed: Procedure(s): HYSTERECTOMY ABDOMINAL (N/A) LYSIS OF ABDOMINAL ADHESIONs (N/A) BILATERAL SALPINGECTOMY (Bilateral)  Patient Location: PACU  Anesthesia Type:General  Level of Consciousness: awake and patient cooperative  Airway & Oxygen Therapy: Patient Spontanous Breathing and Patient connected to nasal cannula oxygen  Post-op Assessment: Report given to PACU RN and Post -op Vital signs reviewed and stable  Post vital signs: Reviewed and stable  Complications: No apparent anesthesia complications

## 2013-10-11 NOTE — Anesthesia Postprocedure Evaluation (Signed)
Anesthesia Post Note  Patient: Cassandra Vaughn  Procedure(s) Performed: Procedure(s) (LRB): HYSTERECTOMY ABDOMINAL (N/A) LYSIS OF ABDOMINAL ADHESIONs (N/A) BILATERAL SALPINGECTOMY (Bilateral)  Anesthesia type: GA  Patient location: PACU  Post pain: Pain level controlled  Post assessment: Post-op Vital signs reviewed  Last Vitals:  Filed Vitals:   10/11/13 0930  BP:   Pulse: 64  Temp:   Resp: 20    Post vital signs: Reviewed  Level of consciousness: sedated  Complications: No apparent anesthesia complications

## 2013-10-11 NOTE — Anesthesia Procedure Notes (Signed)
Procedure Name: Intubation Date/Time: 10/11/2013 7:29 AM Performed by: Flossie Dibble Pre-anesthesia Checklist: Suction available, Emergency Drugs available, Timeout performed, Patient being monitored and Patient identified Patient Re-evaluated:Patient Re-evaluated prior to inductionOxygen Delivery Method: Circle system utilized Preoxygenation: Pre-oxygenation with 100% oxygen Intubation Type: IV induction, Cricoid Pressure applied and Rapid sequence Ventilation: Mask ventilation without difficulty Laryngoscope Size: Mac and 3 Grade View: Grade I Tube type: Oral Tube size: 7.0 mm Number of attempts: 1 Airway Equipment and Method: Stylet Placement Confirmation: ETT inserted through vocal cords under direct vision,  positive ETCO2 and breath sounds checked- equal and bilateral Secured at: 21 cm Tube secured with: Tape Dental Injury: Teeth and Oropharynx as per pre-operative assessment

## 2013-10-12 ENCOUNTER — Encounter (HOSPITAL_COMMUNITY): Payer: Self-pay | Admitting: Gynecology

## 2013-10-12 LAB — CBC
HCT: 37.5 % (ref 36.0–46.0)
HEMOGLOBIN: 12.8 g/dL (ref 12.0–15.0)
MCH: 31 pg (ref 26.0–34.0)
MCHC: 34.1 g/dL (ref 30.0–36.0)
MCV: 90.8 fL (ref 78.0–100.0)
PLATELETS: 176 10*3/uL (ref 150–400)
RBC: 4.13 MIL/uL (ref 3.87–5.11)
RDW: 13.5 % (ref 11.5–15.5)
WBC: 16.2 10*3/uL — AB (ref 4.0–10.5)

## 2013-10-12 MED ORDER — IBUPROFEN 800 MG PO TABS
800.0000 mg | ORAL_TABLET | Freq: Three times a day (TID) | ORAL | Status: DC | PRN
  Administered 2013-10-13: 800 mg via ORAL
  Filled 2013-10-12: qty 1

## 2013-10-12 MED ORDER — OXYCODONE-ACETAMINOPHEN 5-325 MG PO TABS
1.0000 | ORAL_TABLET | ORAL | Status: DC | PRN
  Filled 2013-10-12: qty 2

## 2013-10-12 MED ORDER — IBUPROFEN 800 MG PO TABS
800.0000 mg | ORAL_TABLET | Freq: Three times a day (TID) | ORAL | Status: AC | PRN
  Administered 2013-10-12: 800 mg via ORAL
  Filled 2013-10-12: qty 1

## 2013-10-12 NOTE — Anesthesia Postprocedure Evaluation (Signed)
  Anesthesia Post-op Note  Patient: Cassandra Vaughn  Procedure(s) Performed: Procedure(s): HYSTERECTOMY ABDOMINAL (N/A) LYSIS OF ABDOMINAL ADHESIONs (N/A) BILATERAL SALPINGECTOMY (Bilateral)  Patient Location: [facility]  Anesthesia Type:General  Level of Consciousness: awake, alert  and oriented  Airway and Oxygen Therapy: Patient Spontanous Breathing and Patient connected to nasal cannula oxygen  Post-op Pain: none  Post-op Assessment: Post-op Vital signs reviewed, Patient's Cardiovascular Status Stable, Respiratory Function Stable and Pain level controlled  Post-op Vital Signs: Reviewed and stable  Complications: No apparent anesthesia complications

## 2013-10-12 NOTE — Progress Notes (Signed)
1 Day Post-Op Procedure(s) (LRB): HYSTERECTOMY ABDOMINAL (N/A) LYSIS OF ABDOMINAL ADHESIONs (N/A) BILATERAL SALPINGECTOMY (Bilateral)  Subjective: Patient reports tolerating PO and + flatus.    Objective: I have reviewed patient's vital signs, intake and output, medications and labs. Hgb: 12.8 and HCT 37.5  General: alert and cooperative Resp: clear to auscultation bilaterally Cardio: regular rate and rhythm, S1, S2 normal, no murmur, click, rub or gallop GI: soft, non-tender; bowel sounds normal; no masses,  no organomegaly Extremities: extremities normal, atraumatic, no cyanosis or edema no vaginal bleeding  Assessment: s/p Procedure(s): HYSTERECTOMY ABDOMINAL (N/A) LYSIS OF ABDOMINAL ADHESIONs (N/A) BILATERAL SALPINGECTOMY (Bilateral): stable, progressing well and tolerating diet  Plan: Advance diet Encourage ambulation Advance to PO medication  LOS: 1 day    Shane Badeaux H 10/12/2013, 6:23 AM

## 2013-10-13 NOTE — Discharge Summary (Signed)
Physician Discharge Summary  Patient ID: Cassandra Vaughn MRN: [id number] DOB/AGE: 1966/07/20 48 y.o.  Admit date: 10/11/2013 Discharge date: 10/13/2013  Admission Diagnoses: Symptomatic leiomyomatous uteri  Discharge Diagnoses: Symptomatic leiomyomatous uteri   Discharged Condition: Patient doing well ambulating tolerating regular diet well and voiding without difficulty. Passing gas. No vaginal bleeding. Postop hemoglobin and hematocrit 12.8 and 37.5 respectively  Hospital Course: Patient was admitted to the hospital on 10/11/2013 where by she underwent an abdominal hysterectomy with bilateral salpingectomy for symptomatic leiomyomatous uteri. Patient had excellent urinary output postoperatively and remained afebrile with stable vital signs. After 24 hours her PCA pump and Foley catheter were discontinued and her diet was advanced to regular diet. This morning she was up and ambulating tolerating regular diet well and passing gas. Vital signs were stable. Patient was afebrile and ready to be discharged home. Her incision site was intact and there was no vaginal bleeding.  Consults: None  Significant Diagnostic Studies: labs: Hemoglobin and hematocrit 12.8 and 37.5 respectively. Pathology report:  Diagnosis 1. Uterus and cervix - CERVIX: UNREMARKABLE. - ENDOMETRIUM: INACTIVE WITH FIBROSIS. NO EVIDENCE OF HYPERPLASIA OR CARCINOMA. - MYOMETRIUM: LEIOMYOMATA, SUBMUCOSAL, INTRAMURAL AND SUBSEROSAL WITH FOCAL CALCIFICATIONS. 2. Fallopian tube, right - COMPLETE TRANSECTION OF BENIGN FALLOPIAN TUBE. SEE MICROSCOPIC DESCRIPTION. 3. Fallopian tube, left - COMPLETE TRANSECTION OF BENIGN FALLOPIAN TUBE. SEE MICROSCOPIC DESCRIPTION  Treatments: surgery: Total abdominal hysterectomy with bilateral salpingectomy  Discharge Exam: Blood pressure 148/81, pulse 80, temperature 98.2 F (36.8 C), temperature source Oral, resp. rate 16, height 5\' 5"  (1.651 m), weight 161 lb (73.029 kg), SpO2  99.00%. General appearance: alert and cooperative Resp: clear to auscultation bilaterally Cardio: regular rate and rhythm, S1, S2 normal, no murmur, click, rub or gallop GI: soft, non-tender; bowel sounds normal; no masses,  no organomegaly Extremities: extremities normal, atraumatic, no cyanosis or edema Incision/Wound: intact  Disposition:   Discharge Orders   Future Appointments Provider Department Dept Phone   10/27/2013 11:30 AM Terrance Mass, MD [facility] [phone]   Future Orders Complete By Expires   Call MD for:  redness, tenderness, or signs of infection (pain, swelling, bleeding, redness, odor or green/yellow discharge around incision site)  As directed    Call MD for:  severe or increased pain, loss or decreased feeling  in affected limb(s)  As directed    Call MD for:  temperature >100.5  As directed    Discharge instructions  As directed    Comments:     Post op appointment with Dr. Toney Rakes as previously scheduled  Hysterectomy, Abdominal & Vaginal Care After Refer to this sheet in the next few weeks. These discharge instructions provide you with general information on caring for yourself after you leave the hospital. Your caregiver may also give you specific instructions. Your treatment has been planned according to the most current medical practices available, but unavoidable complications sometimes occur. If you have any problems or questions after discharge, please call your caregiver. HOME CARE INSTRUCTIONS Healing will take time. You will have tenderness at the surgery site. There may be some swelling and bruising around this area if you had an abdominal hysterectomy. Have an adult stay with you the first 48 to 72 hours after surgery, and then for 1 to 2 weeks afterward to help with daily activities. Only take over-the-counter or prescription medicines for pain, discomfort, or fever as directed by your caregiver.  Do not take aspirin. It  can cause bleeding.  Do not drive when taking pain  medicine.  It will be normal to be sore for a couple weeks after surgery. See your caregiver if this seems to be getting worse rather than better.  Follow your caregiver's advice regarding diet, exercise, lifting, driving, and general activities.  Take showers instead of baths for a few weeks as directed.  You may resume your usual diet as directed.  Get plenty of rest and sleep.  Do not douche, use tampons, or have sexual intercourse until your caregiver says it is okay.  Change your bandages (dressings) as directed if you had an abdominal hysterectomy.  Take your temperature twice a day and write it down.  Do not drink alcohol until your caregiver says it is okay.  If you develop constipation, you may take a mild laxative with your caregiver's permission. Eating bran foods helps with constipation problems. Drink enough water and fluids to keep your urine clear or pale yellow.  Do not sign any legal documents until you feel normal again.  Keep all of your follow-up appointments.  Make sure you and your family understand everything about your operation and recovery.  SEEK MEDICAL CARE IF: There is swelling, redness, or increasing pain in the wound area.  Fluid (pus) is coming from the wound.  You notice a bad smell coming from the wound or surgical dressing.  You have pain, redness, and swelling from the intravenous (IV) site.  The wound breaks open.  You feel dizzy.  You develop pain or bleeding when you urinate.  You develop diarrhea.  You feel sick to your stomach (nauseous) and throw up (vomit).  You develop abnormal vaginal discharge.  You develop a rash.  You have any type of abnormal reaction or develop an allergy to your medicine.  Your pain medicine is not relieving your pain.  seek immediate medical care if: You have an oral temperature above 100, not controlled by medicine. HYYou develop chest pain.  You develop shortness of  breath.  You pass out (faint).  You develop pain, swelling, or redness of your leg.  You develop heavy vaginal bleeding, with or without blood clots.  MAKE SURE YOU: Understand these instructions.  Will watch your condition.  Will get help right away if you are not doing well or get worse.  Document Released: 11/16/2003 Document Re-Released: 02/13/2010 [facility] Patient Information 2011 [place].   [facility] HMD8:09 AMTD@   Driving Restrictions  As directed    Comments:     No driving for 1 weeks   Lifting restrictions  As directed    Comments:     No lifting for 6 weeks   Resume previous diet  As directed        Medication List    STOP taking these medications       levonorgestrel-ethinyl estradiol 0.1-20 MG-MCG tablet  Commonly known as:  AVIANE,ALESSE,LESSINA      TAKE these medications       loratadine 10 MG tablet  Commonly known as:  CLARITIN  Take 10 mg by mouth daily.     metoCLOPramide 10 MG tablet  Commonly known as:  REGLAN  Take 1 tablet (10 mg total) by mouth 3 (three) times daily with meals.     multivitamin with minerals Tabs tablet  Take 1 tablet by mouth daily.     oxyCODONE-acetaminophen 7.5-325 MG per tablet  Commonly known as:  PERCOCET  Take 1 tablet by mouth every 4 (four) hours as needed for pain.  SignedTerrance Mass 10/13/2013, 8:10 AM

## 2013-10-13 NOTE — Progress Notes (Signed)
Pt is discharged in the care of husband,with N.T. Escort. Abdominal dressing is clean and dry. Discharge instructions with Rx were given to pt with good understanding. Questions were asked and ans. Denies any pain or discomfort.Stable.

## 2013-10-27 ENCOUNTER — Encounter: Payer: Self-pay | Admitting: Gynecology

## 2013-10-27 ENCOUNTER — Ambulatory Visit (INDEPENDENT_AMBULATORY_CARE_PROVIDER_SITE_OTHER): Payer: BC Managed Care – PPO | Admitting: Gynecology

## 2013-10-27 VITALS — BP 126/78

## 2013-10-27 DIAGNOSIS — Z09 Encounter for follow-up examination after completed treatment for conditions other than malignant neoplasm: Secondary | ICD-10-CM

## 2013-10-27 NOTE — Progress Notes (Signed)
   Patient presents to the office for her two-week postoperative visit. Patient status post total abdominal hysterectomy with lysis of abdominal adhesions and bilateral salpingectomy as a result of symptomatic leiomyomatous uteri. Patient is doing well did not need to take her narcotic. Her postop hemoglobin was 12.8 g. Her pathology report as follows:  Diagnosis 1. Uterus and cervix - CERVIX: UNREMARKABLE. - ENDOMETRIUM: INACTIVE WITH FIBROSIS. NO EVIDENCE OF HYPERPLASIA OR CARCINOMA. - MYOMETRIUM: LEIOMYOMATA, SUBMUCOSAL, INTRAMURAL AND SUBSEROSAL WITH FOCAL CALCIFICATIONS. 2. Fallopian tube, right - COMPLETE TRANSECTION OF BENIGN FALLOPIAN TUBE. SEE MICROSCOPIC DESCRIPTION. 3. Fallopian tube, left - COMPLETE TRANSECTION OF BENIGN FALLOPIAN TUBE. SEE MICROSCOPIC DESCRIPTION.  Microscopic Comment 2. 2-3. Within the connective tissue attached to the fallopian tube there are amorphous collections of homogeneous material surrounded by histiocytes and occasional giant cells. There is no evidence of malignancy. (JDP:caf 10/12/13)  Exam: Pfannenstiel scar completely healed. Abdomen soft nontender no rebound or guarding Pelvic: Urethra Skene was within normal limits Vagina no lesions or discharge Vaginal cuff intact Bimanual exam no palpable mass or tenderness Rectal exam not done  Assessment/plan: Patient 2 weeks status post total abdominal hysterectomy with bilateral salpingectomy for benign symptomatic leiomyomatous uteri doing well. Patient will return back to the office in 4 weeks for final postop visit.

## 2013-11-24 ENCOUNTER — Encounter: Payer: Self-pay | Admitting: Gynecology

## 2013-11-24 ENCOUNTER — Ambulatory Visit (INDEPENDENT_AMBULATORY_CARE_PROVIDER_SITE_OTHER): Payer: BC Managed Care – PPO | Admitting: Gynecology

## 2013-11-24 DIAGNOSIS — N951 Menopausal and female climacteric states: Secondary | ICD-10-CM

## 2013-11-24 DIAGNOSIS — Z09 Encounter for follow-up examination after completed treatment for conditions other than malignant neoplasm: Secondary | ICD-10-CM

## 2013-11-24 NOTE — Progress Notes (Signed)
   Patient presents to the office today for her six-week postop visit. Patient has been complaining of vasomotor symptoms proximally 16 times a day.  Patient presents to the office for her two-week postoperative visit. Patient status post total abdominal hysterectomy with lysis of abdominal adhesions and bilateral salpingectomy as a result of symptomatic leiomyomatous uteri. Patient is doing well did not need to take her narcotic. Her postop hemoglobin was 12.8 g. Her pathology report as follows:  Diagnosis  1. Uterus and cervix  - CERVIX: UNREMARKABLE.  - ENDOMETRIUM: INACTIVE WITH FIBROSIS. NO EVIDENCE OF HYPERPLASIA OR CARCINOMA.  - MYOMETRIUM: LEIOMYOMATA, SUBMUCOSAL, INTRAMURAL AND SUBSEROSAL WITH FOCAL  CALCIFICATIONS.  2. Fallopian tube, right  - COMPLETE TRANSECTION OF BENIGN FALLOPIAN TUBE. SEE MICROSCOPIC DESCRIPTION.  3. Fallopian tube, left  - COMPLETE TRANSECTION OF BENIGN FALLOPIAN TUBE. SEE MICROSCOPIC DESCRIPTION.  Microscopic Comment  2. 2-3. Within the connective tissue attached to the fallopian tube there are amorphous collections of  homogeneous material surrounded by histiocytes and occasional giant cells. There is no evidence of  malignancy. (JDP:caf 10/12/13)  Exam today: Pfannenstiel scar completely healed. Abdomen soft nontender no rebound or guarding  Pelvic: Urethra Skene was within normal limits  Vagina no lesions or discharge  Vaginal cuff intact  Bimanual exam no palpable mass or tenderness  Rectal exam not done  Assessment/plan: Patient 6 weeks status post total dominant hysterectomy with bilateral salpingectomy doing well. The only complaint is vasomotor symptoms occurring 16 times a day. Will check her [place] level today. Literature information on HRT as well as on the perimenopause and menopause was provided. We'll wait for the results and manage accordingly.

## 2013-11-24 NOTE — Patient Instructions (Signed)
Hormone Therapy At menopause, your body begins making less estrogen and progesterone hormones. This causes the body to stop having menstrual periods. This is because estrogen and progesterone hormones control your periods and menstrual cycle. A lack of estrogen may cause symptoms such as:  Hot flushes (or hot flashes).  Vaginal dryness.  Dry skin.  Loss of sex drive.  Risk of bone loss (osteoporosis). When this happens, you may choose to take hormone therapy to get back the estrogen lost during menopause. When the hormone estrogen is given alone, it is usually referred to as ET (Estrogen Therapy). When the hormone progestin is combined with estrogen, it is generally called HT (Hormone Therapy). This was formerly known as hormone replacement therapy (HRT). Your caregiver can help you make a decision on what will be best for you. The decision to use HT seems to change often as new studies are done. Many studies do not agree on the benefits of hormone replacement therapy. LIKELY BENEFITS OF HT INCLUDE PROTECTION FROM:  Hot Flushes (also called hot flashes) - A hot flush is a sudden feeling of heat that spreads over the face and body. The skin may redden like a blush. It is connected with sweats and sleep disturbance. Women going through menopause may have hot flushes a few times a month or several times per day depending on the woman.  Osteoporosis (bone loss)- Estrogen helps guard against bone loss. After menopause, a woman's bones slowly lose calcium and become weak and brittle. As a result, bones are more likely to break. The hip, wrist, and spine are affected most often. Hormone therapy can help slow bone loss after menopause. Weight bearing exercise and taking calcium with vitamin D also can help prevent bone loss. There are also medications that your caregiver can prescribe that can help prevent osteoporosis.  Vaginal Dryness - Loss of estrogen causes changes in the vagina. Its lining may  become thin and dry. These changes can cause pain and bleeding during sexual intercourse. Dryness can also lead to infections. This can cause burning and itching. (Vaginal estrogen treatment can help relieve pain, itching, and dryness.)  Urinary Tract Infections are more common after menopause because of lack of estrogen. Some women also develop urinary incontinence because of low estrogen levels in the vagina and bladder.  Possible other benefits of estrogen include a positive effect on mood and short-term memory in women. RISKS AND COMPLICATIONS  Using estrogen alone without progesterone causes the lining of the uterus to grow. This increases the risk of lining of the uterus (endometrial) cancer. Your caregiver should give another hormone called progestin if you have a uterus.  Women who take combined (estrogen and progestin) HT appear to have an increased risk of breast cancer. The risk appears to be small, but increases throughout the time that HT is taken.  Combined therapy also makes the breast tissue slightly denser which makes it harder to read mammograms (breast X-rays).  Combined, estrogen and progesterone therapy can be taken together every day, in which case there may be spotting of blood. HT therapy can be taken cyclically in which case you will have menstrual periods. Cyclically means HT is taken for a set amount of days, then not taken, then this process is repeated.  HT may increase the risk of stroke, heart attack, breast cancer and forming blood clots in your leg.  Transdermal estrogen (estrogen that is absorbed through the skin with a patch or a cream) may have more positive results with:    Cholesterol.  Blood pressure.  Blood clots. Having the following conditions may indicate you should not have HT:  Endometrial cancer.  Liver disease.  Breast cancer.  Heart disease.  History of blood clots.  Stroke. TREATMENT   If you choose to take HT and have a uterus,  usually estrogen and progestin are prescribed.  Your caregiver will help you decide the best way to take the medications.  Possible ways to take estrogen include:  Pills.  Patches.  Gels.  Sprays.  Vaginal estrogen cream, rings and tablets.  It is best to take the lowest dose possible that will help your symptoms and take them for the shortest period of time that you can.  Hormone therapy can help relieve some of the problems (symptoms) that affect women at menopause. Before making a decision about HT, talk to your caregiver about what is best for you. Be well informed and comfortable with your decisions. HOME CARE INSTRUCTIONS   Follow your caregivers advice when taking the medications.  A Pap test is done to screen for cervical cancer.  The first Pap test should be done at age 74.  Between ages 39 and 31, Pap tests are repeated every 2 years.  Beginning at age 39, you are advised to have a Pap test every 3 years as long as your past 3 Pap tests have been normal.  Some women have medical problems that increase the chance of getting cervical cancer. Talk to your caregiver about these problems. It is especially important to talk to your caregiver if a new problem develops soon after your last Pap test. In these cases, your caregiver may recommend more frequent screening and Pap tests.  The above recommendations are the same for women who have or have not gotten the vaccine for HPV (Human Papillomavirus).  If you had a hysterectomy for a problem that was not a cancer or a condition that could lead to cancer, then you no longer need Pap tests. However, even if you no longer need a Pap test, a regular exam is a good idea to make sure no other problems are starting.   If you are between ages 13 and 31, and you have had normal Pap tests going back 10 years, you no longer need Pap tests. However, even if you no longer need a Pap test, a regular exam is a good idea to make sure no  other problems are starting.   If you have had past treatment for cervical cancer or a condition that could lead to cancer, you need Pap tests and screening for cancer for at least 20 years after your treatment.  If Pap tests have been discontinued, risk factors (such as a new sexual partner) need to be re-assessed to determine if screening should be resumed.  Some women may need screenings more often if they are at high risk for cervical cancer.  Get mammograms done as per the advice of your caregiver. SEEK IMMEDIATE MEDICAL CARE IF:  You develop abnormal vaginal bleeding.  You have pain or swelling in your legs, shortness of breath, or chest pain.  You develop dizziness or headaches.  You have lumps or changes in your breasts or armpits.  You have slurred speech.  You develop weakness or numbness of your arms or legs.  You have pain, burning, or bleeding when urinating.  You develop abdominal pain. Document Released: 05/25/2003 Document Revised: 11/18/2011 Document Reviewed: 09/12/2010 [facility] Patient Information 2014 [place], [place]. Perimenopause Perimenopause is the time when  your body begins to move into the menopause (no menstrual period for 12 straight months). It is a natural process. Perimenopause can begin 2 8 years before the menopause and usually lasts for 1 year after the menopause. During this time, your ovaries may or may not produce an egg. The ovaries vary in their production of estrogen and progesterone hormones each month. This can cause irregular menstrual periods, difficulty getting pregnant, vaginal bleeding between periods, and uncomfortable symptoms. CAUSES  Irregular production of the ovarian hormones, estrogen and progesterone, and not ovulating every month.  Other causes include:  Tumor of the pituitary gland in the brain.  Medical disease that affects the ovaries.  Radiation treatment.  Chemotherapy.  Unknown causes.  Heavy smoking and  excessive alcohol intake can bring on perimenopause sooner. SIGNS AND SYMPTOMS   Hot flashes.  Night sweats.  Irregular menstrual periods.  Decreased sex drive.  Vaginal dryness.  Headaches.  Mood swings.  Depression.  Memory problems.  Irritability.  Tiredness.  Weight gain.  Trouble getting pregnant.  The beginning of losing bone cells (osteoporosis).  The beginning of hardening of the arteries (atherosclerosis). DIAGNOSIS  Your health care provider will make a diagnosis by analyzing your age, menstrual history, and symptoms. He or she will do a physical exam and note any changes in your body, especially your female organs. Female hormone tests may or may not be helpful depending on the amount of female hormones you produce and when you produce them. However, other hormone tests may be helpful to rule out other problems. TREATMENT  In some cases, no treatment is needed. The decision on whether treatment is necessary during the perimenopause should be made by you and your health care provider based on how the symptoms are affecting you and your lifestyle. Various treatments are available, such as:  Treating individual symptoms with a specific medicine for that symptom.  Herbal medicines that can help specific symptoms.  Counseling.  Group therapy. HOME CARE INSTRUCTIONS   Keep track of your menstrual periods (when they occur, how heavy they are, how long between periods, and how long they last) as well as your symptoms and when they started.  Only take over-the-counter or prescription medicines as directed by your health care provider.  Sleep and rest.  Exercise.  Eat a diet that contains calcium (good for your bones) and soy (acts like the estrogen hormone).  Do not smoke.  Avoid alcoholic beverages.  Take vitamin supplements as recommended by your health care provider. Taking vitamin E may help in certain cases.  Take calcium and vitamin D supplements  to help prevent bone loss.  Group therapy is sometimes helpful.  Acupuncture may help in some cases. SEEK MEDICAL CARE IF:   You have questions about any symptoms you are having.  You need a referral to a specialist (gynecologist, psychiatrist, or psychologist). SEEK IMMEDIATE MEDICAL CARE IF:   You have vaginal bleeding.  Your period lasts longer than 8 days.  Your periods are recurring sooner than 21 days.  You have bleeding after intercourse.  You have severe depression.  You have pain when you urinate.  You have severe headaches.  You have vision problems. Document Released: 10/03/2004 Document Revised: 06/16/2013 Document Reviewed: 03/25/2013 [employer] Patient Information 2014 [employer].  

## 2013-11-25 LAB — TSH: TSH: 0.996 u[IU]/mL (ref 0.350–4.500)

## 2013-11-25 LAB — FOLLICLE STIMULATING HORMONE: FSH: 115.9 m[IU]/mL

## 2013-12-08 ENCOUNTER — Encounter: Payer: Self-pay | Admitting: Gynecology

## 2013-12-08 ENCOUNTER — Ambulatory Visit (INDEPENDENT_AMBULATORY_CARE_PROVIDER_SITE_OTHER): Payer: BC Managed Care – PPO | Admitting: Gynecology

## 2013-12-08 VITALS — BP 120/74

## 2013-12-08 DIAGNOSIS — N951 Menopausal and female climacteric states: Secondary | ICD-10-CM

## 2013-12-08 DIAGNOSIS — Z79890 Hormone replacement therapy (postmenopausal): Secondary | ICD-10-CM

## 2013-12-08 MED ORDER — ESTRADIOL 1 MG PO TABS: 1.0000 mg | ORAL_TABLET | Freq: Every day | ORAL | Status: DC

## 2013-12-08 NOTE — Progress Notes (Signed)
   Patient presented to the office today with worsening vasomotor symptoms. 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 Patient's recent [facility] was found to be elevated with a value 115.9.  We had a lengthy discussion of the women's health initiative study in reference to hormone replacement therapy. We discussed the risks benefits and pros and cons. We discussed the risk of DVT, pulmonary embolism, as well as breast cancer. We discussed different routes of administration from oral to transdermal 2 vaginal applications.  We are going to prescribe Estrace 1 mg one by mouth daily. Literature information on hormone replacement therapy was provided. We'll otherwise see her back in one year or when necessary.

## 2013-12-08 NOTE — Patient Instructions (Signed)
Menopause Menopause is the normal time of life when menstrual periods stop completely. Menopause is complete when you have missed 12 consecutive menstrual periods. It usually occurs between the ages of 8 years and 48 years. Very rarely does a woman develop menopause before the age of 60 years. At menopause, your ovaries stop producing the female hormones estrogen and progesterone. This can cause undesirable symptoms and also affect your health. Sometimes the symptoms may occur 4 5 years before the menopause begins. There is no relationship between menopause and:  Oral contraceptives.  Number of children you had.  Race.  The age your menstrual periods started (menarche). Heavy smokers and very thin women may develop menopause earlier in life. CAUSES  The ovaries stop producing the female hormones estrogen and progesterone.  Other causes include:  Surgery to remove both ovaries.  The ovaries stop functioning for no known reason.  Tumors of the pituitary gland in the brain.  Medical disease that affects the ovaries and hormone production.  Radiation treatment to the abdomen or pelvis.  Chemotherapy that affects the ovaries. SYMPTOMS   Hot flashes.  Night sweats.  Decrease in sex drive.  Vaginal dryness and thinning of the vagina causing painful intercourse.  Dryness of the skin and developing wrinkles.  Headaches.  Tiredness.  Irritability.  Memory problems.  Weight gain.  Bladder infections.  Hair growth of the face and chest.  Infertility. More serious symptoms include:  Loss of bone (osteoporosis) causing breaks (fractures).  Depression.  Hardening and narrowing of the arteries (atherosclerosis) causing heart attacks and strokes. DIAGNOSIS   When the menstrual periods have stopped for 12 straight months.  Physical exam.  Hormone studies of the blood. TREATMENT  There are many treatment choices and nearly as many questions about them. The  decisions to treat or not to treat menopausal changes is an individual choice made with your health care provider. Your health care provider can discuss the treatments with you. Together, you can decide which treatment will work best for you. Your treatment choices may include:   Hormone therapy (estrogen and progesterone).  Non-hormonal medicines.  Treating the individual symptoms with medicine (for example antidepressants for depression).  Herbal medicines that may help specific symptoms.  Counseling by a psychiatrist or psychologist.  Group therapy.  Lifestyle changes including:  Eating healthy.  Regular exercise.  Limiting caffeine and alcohol.  Stress management and meditation.  No treatment. HOME CARE INSTRUCTIONS   Take the medicine your health care provider gives you as directed.  Get plenty of sleep and rest.  Exercise regularly.  Eat a diet that contains calcium (good for the bones) and soy products (acts like estrogen hormone).  Avoid alcoholic beverages.  Do not smoke.  If you have hot flashes, dress in layers.  Take supplements, calcium, and vitamin D to strengthen bones.  You can use over-the-counter lubricants or moisturizers for vaginal dryness.  Group therapy is sometimes very helpful.  Acupuncture may be helpful in some cases. SEEK MEDICAL CARE IF:   You are not sure you are in menopause.  You are having menopausal symptoms and need advice and treatment.  You are still having menstrual periods after age 71 years.  You have pain with intercourse.  Menopause is complete (no menstrual period for 12 months) and you develop vaginal bleeding.  You need a referral to a specialist (gynecologist, psychiatrist, or psychologist) for treatment. SEEK IMMEDIATE MEDICAL CARE IF:   You have severe depression.  You have excessive vaginal bleeding.  You fell and think you have a broken bone.  You have pain when you urinate.  You develop leg or  chest pain.  You have a fast pounding heart beat (palpitations).  You have severe headaches.  You develop vision problems.  You feel a lump in your breast.  You have abdominal pain or severe indigestion. Document Released: 11/16/2003 Document Revised: 04/28/2013 Document Reviewed: 03/25/2013 [facility] Patient Information 2014 [place], [place]. Hormone Therapy At menopause, your body begins making less estrogen and progesterone hormones. This causes the body to stop having menstrual periods. This is because estrogen and progesterone hormones control your periods and menstrual cycle. A lack of estrogen may cause symptoms such as:  Hot flushes (or hot flashes).  Vaginal dryness.  Dry skin.  Loss of sex drive.  Risk of bone loss (osteoporosis). When this happens, you may choose to take hormone therapy to get back the estrogen lost during menopause. When the hormone estrogen is given alone, it is usually referred to as ET (Estrogen Therapy). When the hormone progestin is combined with estrogen, it is generally called HT (Hormone Therapy). This was formerly known as hormone replacement therapy (HRT). Your caregiver can help you make a decision on what will be best for you. The decision to use HT seems to change often as new studies are done. Many studies do not agree on the benefits of hormone replacement therapy. LIKELY BENEFITS OF HT INCLUDE PROTECTION FROM:  Hot Flushes (also called hot flashes) - A hot flush is a sudden feeling of heat that spreads over the face and body. The skin may redden like a blush. It is connected with sweats and sleep disturbance. Women going through menopause may have hot flushes a few times a month or several times per day depending on the woman.  Osteoporosis (bone loss)- Estrogen helps guard against bone loss. After menopause, a woman's bones slowly lose calcium and become weak and brittle. As a result, bones are more likely to break. The hip, wrist, and spine  are affected most often. Hormone therapy can help slow bone loss after menopause. Weight bearing exercise and taking calcium with vitamin D also can help prevent bone loss. There are also medications that your caregiver can prescribe that can help prevent osteoporosis.  Vaginal Dryness - Loss of estrogen causes changes in the vagina. Its lining may become thin and dry. These changes can cause pain and bleeding during sexual intercourse. Dryness can also lead to infections. This can cause burning and itching. (Vaginal estrogen treatment can help relieve pain, itching, and dryness.)  Urinary Tract Infections are more common after menopause because of lack of estrogen. Some women also develop urinary incontinence because of low estrogen levels in the vagina and bladder.  Possible other benefits of estrogen include a positive effect on mood and short-term memory in women. RISKS AND COMPLICATIONS  Using estrogen alone without progesterone causes the lining of the uterus to grow. This increases the risk of lining of the uterus (endometrial) cancer. Your caregiver should give another hormone called progestin if you have a uterus.  Women who take combined (estrogen and progestin) HT appear to have an increased risk of breast cancer. The risk appears to be small, but increases throughout the time that HT is taken.  Combined therapy also makes the breast tissue slightly denser which makes it harder to read mammograms (breast X-rays).  Combined, estrogen and progesterone therapy can be taken together every day, in which case there may be spotting of blood.  HT therapy can be taken cyclically in which case you will have menstrual periods. Cyclically means HT is taken for a set amount of days, then not taken, then this process is repeated.  HT may increase the risk of stroke, heart attack, breast cancer and forming blood clots in your leg.  Transdermal estrogen (estrogen that is absorbed through the skin with a  patch or a cream) may have more positive results with:  Cholesterol.  Blood pressure.  Blood clots. Having the following conditions may indicate you should not have HT:  Endometrial cancer.  Liver disease.  Breast cancer.  Heart disease.  History of blood clots.  Stroke. TREATMENT   If you choose to take HT and have a uterus, usually estrogen and progestin are prescribed.  Your caregiver will help you decide the best way to take the medications.  Possible ways to take estrogen include:  Pills.  Patches.  Gels.  Sprays.  Vaginal estrogen cream, rings and tablets.  It is best to take the lowest dose possible that will help your symptoms and take them for the shortest period of time that you can.  Hormone therapy can help relieve some of the problems (symptoms) that affect women at menopause. Before making a decision about HT, talk to your caregiver about what is best for you. Be well informed and comfortable with your decisions. HOME CARE INSTRUCTIONS   Follow your caregivers advice when taking the medications.  A Pap test is done to screen for cervical cancer.  The first Pap test should be done at age 62.  Between ages 11 and 39, Pap tests are repeated every 2 years.  Beginning at age 51, you are advised to have a Pap test every 3 years as long as your past 3 Pap tests have been normal.  Some women have medical problems that increase the chance of getting cervical cancer. Talk to your caregiver about these problems. It is especially important to talk to your caregiver if a new problem develops soon after your last Pap test. In these cases, your caregiver may recommend more frequent screening and Pap tests.  The above recommendations are the same for women who have or have not gotten the vaccine for HPV (Human Papillomavirus).  If you had a hysterectomy for a problem that was not a cancer or a condition that could lead to cancer, then you no longer need Pap  tests. However, even if you no longer need a Pap test, a regular exam is a good idea to make sure no other problems are starting.   If you are between ages 72 and 60, and you have had normal Pap tests going back 10 years, you no longer need Pap tests. However, even if you no longer need a Pap test, a regular exam is a good idea to make sure no other problems are starting.   If you have had past treatment for cervical cancer or a condition that could lead to cancer, you need Pap tests and screening for cancer for at least 20 years after your treatment.  If Pap tests have been discontinued, risk factors (such as a new sexual partner) need to be re-assessed to determine if screening should be resumed.  Some women may need screenings more often if they are at high risk for cervical cancer.  Get mammograms done as per the advice of your caregiver. SEEK IMMEDIATE MEDICAL CARE IF:  You develop abnormal vaginal bleeding.  You have pain or swelling in your  legs, shortness of breath, or chest pain.  You develop dizziness or headaches.  You have lumps or changes in your breasts or armpits.  You have slurred speech.  You develop weakness or numbness of your arms or legs.  You have pain, burning, or bleeding when urinating.  You develop abdominal pain. Document Released: 05/25/2003 Document Revised: 11/18/2011 Document Reviewed: 09/12/2010 [facility] [place] [facility] Patient Information 2014 [place], [place].

## 2014-03-18 ENCOUNTER — Other Ambulatory Visit: Payer: Self-pay

## 2014-03-18 DIAGNOSIS — Z1231 Encounter for screening mammogram for malignant neoplasm of breast: Secondary | ICD-10-CM

## 2014-03-30 ENCOUNTER — Ambulatory Visit
Admission: RE | Admit: 2014-03-30 | Discharge: 2014-03-30 | Disposition: A | Discharge status: Home / Self Care | Payer: BC Managed Care – PPO | Source: Ambulatory Visit

## 2014-03-30 DIAGNOSIS — Z1231 Encounter for screening mammogram for malignant neoplasm of breast: Secondary | ICD-10-CM

## 2014-07-11 ENCOUNTER — Encounter: Payer: Self-pay | Admitting: Gynecology

## 2014-10-26 ENCOUNTER — Encounter: Payer: Self-pay | Admitting: Gynecology

## 2014-10-26 ENCOUNTER — Telehealth: Payer: Self-pay | Admitting: *Deleted

## 2014-10-26 ENCOUNTER — Ambulatory Visit (INDEPENDENT_AMBULATORY_CARE_PROVIDER_SITE_OTHER): Payer: BLUE CROSS/BLUE SHIELD | Admitting: Gynecology

## 2014-10-26 VITALS — BP 112/70

## 2014-10-26 DIAGNOSIS — B3731 Acute candidiasis of vulva and vagina: Secondary | ICD-10-CM

## 2014-10-26 DIAGNOSIS — B373 Candidiasis of vulva and vagina: Secondary | ICD-10-CM

## 2014-10-26 DIAGNOSIS — N760 Acute vaginitis: Secondary | ICD-10-CM

## 2014-10-26 DIAGNOSIS — A499 Bacterial infection, unspecified: Secondary | ICD-10-CM

## 2014-10-26 DIAGNOSIS — B9689 Other specified bacterial agents as the cause of diseases classified elsewhere: Secondary | ICD-10-CM

## 2014-10-26 DIAGNOSIS — N898 Other specified noninflammatory disorders of vagina: Secondary | ICD-10-CM

## 2014-10-26 LAB — WET PREP FOR TRICH, YEAST, CLUE: Trich, Wet Prep: NONE SEEN

## 2014-10-26 MED ORDER — FLUCONAZOLE 150 MG PO TABS: 150.0000 mg | ORAL_TABLET | Freq: Once | ORAL | Status: DC

## 2014-10-26 MED ORDER — TINIDAZOLE 500 MG PO TABS: ORAL_TABLET | ORAL | Status: DC

## 2014-10-26 MED ORDER — TINIDAZOLE 500 MG PO TABS: 500.0000 mg | ORAL_TABLET | Freq: Once | ORAL | Status: DC

## 2014-10-26 NOTE — Progress Notes (Signed)
   49 year old patient presented to the office complaining of three-week history of a vaginal discharge clear in appearance with fishy like odor. Patient with prior hysterectomy. Patient in a monogamous relationship. Patient is informing that her sister was recently diagnosed with multiple sclerosis at the age of 65. Patient concern whether she may have similar symptoms and whether or not this is hereditary.  Exam: Bartholin urethra Skene glands within normal limits Vagina: Clear fishy like odor discharge was noted Vaginal cuff: Intact Bimanual exam: Not done Rectal exam: Not done  Wet prep: Few yeast Positive amine moderate clue cells, rare WBC, too numerous to count bacteria  Assessment/plan: #1 bacterial vaginosis will be treated with Tindamax 500 mg tablets she is to take 4 tablets today and repeat in 24 hours #2 yeast vaginitis patient will be prescribed Diflucan 150 mg one by mouth #3 patient will be given the name of neurologist for her to follow-up for evaluation and questions that she has because of her sister with MS and she herself complaining of some left leg weakness.

## 2014-10-26 NOTE — Patient Instructions (Signed)
Monilial Vaginitis Vaginitis in a soreness, swelling and redness (inflammation) of the vagina and vulva. Monilial vaginitis is not a sexually transmitted infection. CAUSES  Yeast vaginitis is caused by yeast (candida) that is normally found in your vagina. With a yeast infection, the candida has overgrown in number to a point that upsets the chemical balance. SYMPTOMS   White, thick vaginal discharge.  Swelling, itching, redness and irritation of the vagina and possibly the lips of the vagina (vulva).  Burning or painful urination.  Painful intercourse. DIAGNOSIS  Things that may contribute to monilial vaginitis are:  Postmenopausal and virginal states.  Pregnancy.  Infections.  Being tired, sick or stressed, especially if you had monilial vaginitis in the past.  Diabetes. Good control will help lower the chance.  Birth control pills.  Tight fitting garments.  Using bubble bath, feminine sprays, douches or deodorant tampons.  Taking certain medications that kill germs (antibiotics).  Sporadic recurrence can occur if you become ill. TREATMENT  Your caregiver will give you medication.  There are several kinds of anti monilial vaginal creams and suppositories specific for monilial vaginitis. For recurrent yeast infections, use a suppository or cream in the vagina 2 times a week, or as directed.  Anti-monilial or steroid cream for the itching or irritation of the vulva may also be used. Get your caregiver's permission.  Painting the vagina with methylene blue solution may help if the monilial cream does not work.  Eating yogurt may help prevent monilial vaginitis. HOME CARE INSTRUCTIONS   Finish all medication as prescribed.  Do not have sex until treatment is completed or after your caregiver tells you it is okay.  Take warm sitz baths.  Do not douche.  Do not use tampons, especially scented ones.  Wear cotton underwear.  Avoid tight pants and panty  hose.  Tell your sexual partner that you have a yeast infection. They should go to their caregiver if they have symptoms such as mild rash or itching.  Your sexual partner should be treated as well if your infection is difficult to eliminate.  Practice safer sex. Use condoms.  Some vaginal medications cause latex condoms to fail. Vaginal medications that harm condoms are:  Cleocin cream.  Butoconazole (Femstat).  Terconazole (Terazol) vaginal suppository.  Miconazole (Monistat) (may be purchased over the counter). SEEK MEDICAL CARE IF:   You have a temperature by mouth above 102 F (38.9 C).  The infection is getting worse after 2 days of treatment.  The infection is not getting better after 3 days of treatment.  You develop blisters in or around your vagina.  You develop vaginal bleeding, and it is not your menstrual period.  You have pain when you urinate.  You develop intestinal problems.  You have pain with sexual intercourse. Document Released: 06/05/2005 Document Revised: 11/18/2011 Document Reviewed: 02/17/2009 [facility] Patient Information 2015 [place], [place]. This information is not intended to replace advice given to you by your health care provider. Make sure you discuss any questions you have with your health care provider. Bacterial Vaginosis Bacterial vaginosis is a vaginal infection that occurs when the normal balance of bacteria in the vagina is disrupted. It results from an overgrowth of certain bacteria. This is the most common vaginal infection in women of childbearing age. Treatment is important to prevent complications, especially in pregnant women, as it can cause a premature delivery. CAUSES  Bacterial vaginosis is caused by an increase in harmful bacteria that are normally present in smaller amounts in the  vagina. Several different kinds of bacteria can cause bacterial vaginosis. However, the reason that the condition develops is not fully  understood. RISK FACTORS Certain activities or behaviors can put you at an increased risk of developing bacterial vaginosis, including:  Having a new sex partner or multiple sex partners.  Douching.  Using an intrauterine device (IUD) for contraception. Women do not get bacterial vaginosis from toilet seats, bedding, swimming pools, or contact with objects around them. SIGNS AND SYMPTOMS  Some women with bacterial vaginosis have no signs or symptoms. Common symptoms include:  Grey vaginal discharge.  A fishlike odor with discharge, especially after sexual intercourse.  Itching or burning of the vagina and vulva.  Burning or pain with urination. DIAGNOSIS  Your health care provider will take a medical history and examine the vagina for signs of bacterial vaginosis. A sample of vaginal fluid may be taken. Your health care provider will look at this sample under a microscope to check for bacteria and abnormal cells. A vaginal pH test may also be done.  TREATMENT  Bacterial vaginosis may be treated with antibiotic medicines. These may be given in the form of a pill or a vaginal cream. A second round of antibiotics may be prescribed if the condition comes back after treatment.  HOME CARE INSTRUCTIONS   Only take over-the-counter or prescription medicines as directed by your health care provider.  If antibiotic medicine was prescribed, take it as directed. Make sure you finish it even if you start to feel better.  Do not have sex until treatment is completed.  Tell all sexual partners that you have a vaginal infection. They should see their health care provider and be treated if they have problems, such as a mild rash or itching.  Practice safe sex by using condoms and only having one sex partner. SEEK MEDICAL CARE IF:   Your symptoms are not improving after 3 days of treatment.  You have increased discharge or pain.  You have a fever. MAKE SURE YOU:   Understand these  instructions.  Will watch your condition.  Will get help right away if you are not doing well or get worse. FOR MORE INFORMATION  Centers for Disease Control and Prevention, Division of STD Prevention: AppraiserFraud.fi American Sexual Health Association (ASHA): www.ashastd.org  Document Released: 08/26/2005 Document Revised: 06/16/2013 Document Reviewed: 04/07/2013 [facility] Patient Information 2015 [place], [place]. This information is not intended to replace advice given to you by your health care provider. Make sure you discuss any questions you have with your health care provider. Fluconazole injection What is this medicine? FLUCONAZOLE (floo KON na zole) is an antifungal medicine. It is used to treat or prevent certain kinds of fungal or yeast infections. This medicine may be used for other purposes; ask your health care provider or pharmacist if you have questions. COMMON BRAND NAME(S): Diflucan What should I tell my health care provider before I take this medicine? They need to know if you have any of these conditions: -history of irregular heart beat -kidney disease -an unusual or allergic reaction to fluconazole, other antifungal medicines, foods, dyes or preservatives -pregnant or trying to get pregnant -breast-feeding How should I use this medicine? This medicine is for injection into a vein. It is usually given by a health care professional in a hospital or clinic setting. If you get this medicine at home, you will be taught how to prepare and give this medicine. Use exactly as directed. Take your medicine at regular intervals. Do not  take your medicine more often than directed. It is important that you put your used needles and syringes in a special sharps container. Do not put them in a trash can. If you do not have a sharps container, call your pharmacist or healthcare provider to get one. Talk to your pediatrician regarding the use of this medicine in children. Special care may  be needed. Overdosage: If you think you have taken too much of this medicine contact a poison control center or emergency room at once. NOTE: This medicine is only for you. Do not share this medicine with others. What if I miss a dose? This does not apply. What may interact with this medicine? Do not take this medicine with any of the following medications: -cisapride -pimozide -red yeast rice This medicine may also interact with the following medications: -birth control pills -cyclosporine -diuretics like hydrochlorothiazide -medicines for diabetes that are taken by mouth -medicines for high cholesterol like atorvastatin, lovastatin or simvastatin -phenytoin -ramelteon -rifabutin -rifampin -some medicines for anxiety or sleep -tacrolimus -terfenadine -theophylline -tofacitinib -warfarin This list may not describe all possible interactions. Give your health care provider a list of all the medicines, herbs, non-prescription drugs, or dietary supplements you use. Also tell them if you smoke, drink alcohol, or use illegal drugs. Some items may interact with your medicine. What should I watch for while using this medicine? Tell your doctor if your symptoms do not improve. If you are taking this medicine for a long time you may need blood work. Some fungal infections need many weeks or months of treatment to cure completely. Alcohol can increase possible damage to your liver from this medicine. Avoid alcoholic drinks. What side effects may I notice from receiving this medicine? Side effects that you should report to your doctor or health care professional as soon as possible: -allergic reactions like skin rash or itching, hives, swelling of the lips, mouth, tongue, or throat -dark urine -feeling dizzy or faint -irregular heartbeat or chest pain -pain, redness at site of injection -redness, blistering, peeling or loosening of the skin, including inside the mouth -stomach  pain -trouble breathing -unusual bruising or bleeding -vomiting -yellowing of the eyes or skin Side effects that usually do not require medical attention (report to your doctor or health care professional if they continue or are bothersome): -changes in how food tastes -diarrhea -headache -stomach upset, nausea This list may not describe all possible side effects. Call your doctor for medical advice about side effects. You may report side effects to FDA at 1-800-FDA-1088. Where should I keep my medicine? Keep out of the reach of children. If you are using this medicine at home, you will be instructed on how to store this medicine. Throw away any unused medicine after the expiration date on the label. NOTE: This sheet is a summary. It may not cover all possible information. If you have questions about this medicine, talk to your doctor, pharmacist, or health care provider.  2015, Elsevier/Gold Standard. (2013-04-03 15:51:41) Tinidazole tablets What is this medicine? TINIDAZOLE (tye NI da zole) is an antiinfective. It is used to treat amebiasis, giardiasis, trichomoniasis, and vaginosis. It will not work for colds, flu, or other viral infections. This medicine may be used for other purposes; ask your health care provider or pharmacist if you have questions. COMMON BRAND NAME(S): Tindamax What should I tell my health care provider before I take this medicine? They need to know if you have any of these conditions: -anemia or other blood  disorders -if you frequently drink alcohol containing drinks -receiving hemodialysis -seizure disorder -an unusual or allergic reaction to tinidazole, other medicines, foods, dyes, or preservatives -pregnant or trying to get pregnant -breast-feeding How should I use this medicine? Take this medicine by mouth with a full glass of water. Follow the directions on the prescription label. Take with food. Take your medicine at regular intervals. Do not take your  medicine more often than directed. Take all of your medicine as directed even if you think you are better. Do not skip doses or stop your medicine early. Talk to your pediatrician regarding the use of this medicine in children. While this drug may be prescribed for children as young as 67 years of age for selected conditions, precautions do apply. Overdosage: If you think you have taken too much of this medicine contact a poison control center or emergency room at once. NOTE: This medicine is only for you. Do not share this medicine with others. What if I miss a dose? If you miss a dose, take it as soon as you can. If it is almost time for your next dose, take only that dose. Do not take double or extra doses. What may interact with this medicine? Do not take this medicine with any of the following medications: -alcohol or any product that contains alcohol -amprenavir oral solution -disulfiram -paclitaxel injection -ritonavir oral solution -sertraline oral solution -sulfamethoxazole-trimethoprim injection This medicine may also interact with the following medications: -cholestyramine -cimetidine -conivaptan -cyclosporin -fluorouracil -fosphenytoin, phenytoin -ketoconazole -lithium -phenobarbital -tacrolimus -warfarin This list may not describe all possible interactions. Give your health care provider a list of all the medicines, herbs, non-prescription drugs, or dietary supplements you use. Also tell them if you smoke, drink alcohol, or use illegal drugs. Some items may interact with your medicine. What should I watch for while using this medicine? Tell your doctor or health care professional if your symptoms do not improve or if they get worse. Avoid alcoholic drinks while you are taking this medicine and for three days afterward. Alcohol may make you feel dizzy, sick, or flushed. If you are being treated for a sexually transmitted disease, avoid sexual contact until you have finished  your treatment. Your sexual partner may also need treatment. What side effects may I notice from receiving this medicine? Side effects that you should report to your doctor or health care professional as soon as possible: -allergic reactions like skin rash, itching or hives, swelling of the face, lips, or tongue -breathing problems -confusion, depression -dark or white patches in the mouth -feeling faint or lightheaded, falls -fever, infection -numbness, tingling, pain or weakness in the hands or feet -pain when passing urine -seizures -unusually weak or tired -vaginal irritation or discharge -vomiting Side effects that usually do not require medical attention (report to your doctor or health care professional if they continue or are bothersome): -dark brown or reddish urine -diarrhea -headache -loss of appetite -metallic taste -nausea -stomach upset This list may not describe all possible side effects. Call your doctor for medical advice about side effects. You may report side effects to FDA at 1-800-FDA-1088. Where should I keep my medicine? Keep out of the reach of children. Store at room temperature between 15 and 30 degrees C (59 and 86 degrees F). Protect from light and moisture. Keep container tightly closed. Throw away any unused medicine after the expiration date. NOTE: This sheet is a summary. It may not cover all possible information. If you have questions about  this medicine, talk to your doctor, pharmacist, or health care provider.  2015, Elsevier/Gold Standard. (2008-05-23 15:22:28)

## 2014-10-26 NOTE — Telephone Encounter (Signed)
Pharmacy called with questions about directions for tindamax which are incorrect, per note "Tindamax 500 mg tablets she is to take 4 tablets" today and repeat in 24 hours will be sent to pharmacy. The direction for take 1 tablet once was incorrect.

## 2014-11-14 ENCOUNTER — Other Ambulatory Visit (HOSPITAL_COMMUNITY)
Admission: RE | Admit: 2014-11-14 | Discharge: 2014-11-14 | Disposition: A | Discharge status: Home / Self Care | Payer: BLUE CROSS/BLUE SHIELD | Source: Ambulatory Visit | Attending: Gynecology | Admitting: Gynecology

## 2014-11-14 ENCOUNTER — Ambulatory Visit (INDEPENDENT_AMBULATORY_CARE_PROVIDER_SITE_OTHER): Payer: BLUE CROSS/BLUE SHIELD | Admitting: Gynecology

## 2014-11-14 ENCOUNTER — Encounter: Payer: Self-pay | Admitting: Gynecology

## 2014-11-14 VITALS — BP 120/78 | Ht 65.0 in | Wt 154.0 lb

## 2014-11-14 DIAGNOSIS — Z803 Family history of malignant neoplasm of breast: Secondary | ICD-10-CM | POA: Diagnosis not present

## 2014-11-14 DIAGNOSIS — Z01419 Encounter for gynecological examination (general) (routine) without abnormal findings: Secondary | ICD-10-CM

## 2014-11-14 LAB — CBC WITH DIFFERENTIAL/PLATELET
BASOS ABS: 0 10*3/uL (ref 0.0–0.1)
BASOS PCT: 1 % (ref 0–1)
EOS ABS: 0 10*3/uL (ref 0.0–0.7)
Eosinophils Relative: 1 % (ref 0–5)
HEMATOCRIT: 45.5 % (ref 36.0–46.0)
Hemoglobin: 14.9 g/dL (ref 12.0–15.0)
LYMPHS ABS: 1.7 10*3/uL (ref 0.7–4.0)
Lymphocytes Relative: 42 % (ref 12–46)
MCH: 30.8 pg (ref 26.0–34.0)
MCHC: 32.7 g/dL (ref 30.0–36.0)
MCV: 94 fL (ref 78.0–100.0)
MONO ABS: 0.4 10*3/uL (ref 0.1–1.0)
MONOS PCT: 11 % (ref 3–12)
MPV: 11.4 fL (ref 8.6–12.4)
Neutro Abs: 1.8 10*3/uL (ref 1.7–7.7)
Neutrophils Relative %: 45 % (ref 43–77)
Platelets: 192 10*3/uL (ref 150–400)
RBC: 4.84 MIL/uL (ref 3.87–5.11)
RDW: 13.9 % (ref 11.5–15.5)
WBC: 4 10*3/uL (ref 4.0–10.5)

## 2014-11-14 LAB — LIPID PANEL
CHOL/HDL RATIO: 2.2 ratio
Cholesterol: 234 mg/dL — ABNORMAL HIGH (ref 0–200)
HDL: 108 mg/dL (ref >=46)
LDL CALC: 112 mg/dL — AB (ref 0–99)
TRIGLYCERIDES: 68 mg/dL (ref <150)
VLDL: 14 mg/dL (ref 0–40)

## 2014-11-14 LAB — COMPREHENSIVE METABOLIC PANEL
ALT: 19 U/L (ref 0–35)
AST: 23 U/L (ref 0–37)
Albumin: 4.2 g/dL (ref 3.5–5.2)
Alkaline Phosphatase: 80 U/L (ref 39–117)
BUN: 14 mg/dL (ref 6–23)
CALCIUM: 9.9 mg/dL (ref 8.4–10.5)
CO2: 28 mEq/L (ref 19–32)
CREATININE: 0.89 mg/dL (ref 0.50–1.10)
Chloride: 103 mEq/L (ref 96–112)
GLUCOSE: 79 mg/dL (ref 70–99)
Potassium: 4.3 mEq/L (ref 3.5–5.3)
SODIUM: 138 meq/L (ref 135–145)
Total Bilirubin: 0.8 mg/dL (ref 0.2–1.2)
Total Protein: 7.3 g/dL (ref 6.0–8.3)

## 2014-11-14 LAB — TSH: TSH: 0.966 u[IU]/mL (ref 0.350–4.500)

## 2014-11-14 NOTE — Progress Notes (Signed)
Cassandra Vaughn 12-09-1965 [id number]   History:    49 y.o.  for annual gyn exam with no complaints today. 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 She has done well. Also review of her record indicated in in 1996 she had LEEP cervical conization for dysplasia and subsequent Pap smears have been normal. Patient also has a sister with breast cancer and patient herself has been tested with negative BRCA1 and BRCA2 gene mutation not having been detected.patient also has a strong family history of diabetes (father).  Past medical history,surgical history, family history and social history were all reviewed and documented in the EPIC chart.  Gynecologic History Patient's last menstrual period was 09/28/2013. Contraception: status post hysterectomy Last Pap: 2014. Results were: normal Last mammogram: 2015. Results were: normal  Obstetric History OB History  Gravida Para Term Preterm AB SAB TAB Ectopic Multiple Living  2 0   2     0    # Outcome Date GA Lbr Len/2nd Weight Sex Delivery Anes PTL Lv  2 AB           1 AB                ROS: A ROS was performed and pertinent positives and negatives are included in the history.  GENERAL: No fevers or chills. HEENT: No change in vision, no earache, sore throat or sinus congestion. NECK: No pain or stiffness. CARDIOVASCULAR: No chest pain or pressure. No palpitations. PULMONARY: No shortness of breath, cough or wheeze. GASTROINTESTINAL: No abdominal pain, nausea, vomiting or diarrhea, melena or bright red blood per rectum. GENITOURINARY: No urinary frequency, urgency, hesitancy or dysuria. MUSCULOSKELETAL: No joint or muscle pain, no back pain, no recent trauma.  DERMATOLOGIC: No rash, no itching, no lesions. ENDOCRINE: No polyuria, polydipsia, no heat or cold intolerance. No recent change in weight. HEMATOLOGICAL: No anemia or easy bruising or bleeding. NEUROLOGIC: No headache, seizures, numbness, tingling or weakness. PSYCHIATRIC: No depression, no loss of interest in normal activity or change in sleep pattern.     Exam: chaperone present  BP 120/78 mmHg  Ht $R'5\' 5"'uD$  (1.651 m)  Wt 154 lb (69.854 kg)  BMI 25.63 kg/m2  LMP 09/28/2013  Body mass index is 25.63 kg/(m^2).  General appearance : Well developed well nourished female. No acute distress HEENT: Eyes: no retinal hemorrhage or exudates,  Neck supple, trachea midline, no carotid bruits, no thyroidmegaly Lungs: Clear to auscultation, no rhonchi or wheezes, or rib retractions  Heart: Regular rate and rhythm, no murmurs or gallops Breast:Examined in sitting and supine position were symmetrical in appearance, no palpable masses or tenderness,  no skin retraction, no nipple inversion, no nipple discharge, no skin discoloration, no axillary or supraclavicular lymphadenopathy Abdomen: no palpable masses or tenderness, no rebound or guarding Extremities: no edema or skin discoloration or tenderness  Pelvic:  Bartholin, Urethra, Skene Glands: Within normal limits             Vagina: No gross lesions or discharge  Cervix: absent  Uterus  absent  Adnexa  Without masses or tenderness  Anus and perineum  normal   Rectovaginal  normal sphincter tone without palpated masses or tenderness  Hemoccult not indicated     Assessment/Plan:  49 y.o. female for annual exam doing well on Estrace 1 mg daily for vasomotor symptoms. Estrogen replacement therapy was started in 2015. We discussed again the women's health initiative study as well as a risk benefits and pros and cons of estrogen replacement therapy. She was reminded on the importance of monthly breast exams. She is due for her next mammogram  next month. Also she will turn 60 in May of this year and I discussed with her the importance of screening colonoscopy. The following labs ordered today: CBC, comprehensive metabolic panel, TSH, fasting lipid profile and urinalysis. Pap smear was done today.   Terrance Mass MD, 9:22 AM 11/14/2014

## 2014-11-14 NOTE — Patient Instructions (Signed)
Colonoscopy  A colonoscopy is an exam to look at the entire large intestine (colon). This exam can help find problems such as tumors, polyps, inflammation, and areas of bleeding. The exam takes about 1 hour.   LET YOUR HEALTH CARE PROVIDER KNOW ABOUT:   · Any allergies you have.  · All medicines you are taking, including vitamins, herbs, eye drops, creams, and over-the-counter medicines.  · Previous problems you or members of your family have had with the use of anesthetics.  · Any blood disorders you have.  · Previous surgeries you have had.  · Medical conditions you have.  RISKS AND COMPLICATIONS   Generally, this is a safe procedure. However, as with any procedure, complications can occur. Possible complications include:  · Bleeding.  · Tearing or rupture of the colon wall.  · Reaction to medicines given during the exam.  · Infection (rare).  BEFORE THE PROCEDURE   · Ask your health care provider about changing or stopping your regular medicines.  · You may be prescribed an oral bowel prep. This involves drinking a large amount of medicated liquid, starting the day before your procedure. The liquid will cause you to have multiple loose stools until your stool is almost clear or light green. This cleans out your colon in preparation for the procedure.  · Do not eat or drink anything else once you have started the bowel prep, unless your health care provider tells you it is safe to do so.  · Arrange for someone to drive you home after the procedure.  PROCEDURE   · You will be given medicine to help you relax (sedative).  · You will lie on your side with your knees bent.  · A long, flexible tube with a light and camera on the end (colonoscope) will be inserted through the rectum and into the colon. The camera sends video back to a computer screen as it moves through the colon. The colonoscope also releases carbon dioxide gas to inflate the colon. This helps your health care provider see the area better.  · During  the exam, your health care provider may take a small tissue sample (biopsy) to be examined under a microscope if any abnormalities are found.  · The exam is finished when the entire colon has been viewed.  AFTER THE PROCEDURE   · Do not drive for 24 hours after the exam.  · You may have a small amount of blood in your stool.  · You may pass moderate amounts of gas and have mild abdominal cramping or bloating. This is caused by the gas used to inflate your colon during the exam.  · Ask when your test results will be ready and how you will get your results. Make sure you get your test results.  Document Released: 08/23/2000 Document Revised: 06/16/2013 Document Reviewed: 05/03/2013  [employer]® Patient Information ©2015 [employer]. This information is not intended to replace advice given to you by your health care provider. Make sure you discuss any questions you have with your health care provider.

## 2014-11-15 LAB — URINALYSIS W MICROSCOPIC + REFLEX CULTURE
Bacteria, UA: NONE SEEN
Bilirubin Urine: NEGATIVE
CRYSTALS: NONE SEEN
Casts: NONE SEEN
Glucose, UA: NEGATIVE mg/dL
HGB URINE DIPSTICK: NEGATIVE
KETONES UR: NEGATIVE mg/dL
Leukocytes, UA: NEGATIVE
Nitrite: NEGATIVE
Protein, ur: NEGATIVE mg/dL
Specific Gravity, Urine: 1.022 (ref 1.005–1.030)
Squamous Epithelial / LPF: NONE SEEN
UROBILINOGEN UA: 0.2 mg/dL (ref 0.0–1.0)
pH: 6.5 (ref 5.0–8.0)

## 2014-11-15 LAB — CYTOLOGY - PAP

## 2014-12-28 ENCOUNTER — Other Ambulatory Visit: Payer: Self-pay | Admitting: Gynecology

## 2014-12-28 NOTE — Telephone Encounter (Signed)
Per note on OV 11/14/14 "female for annual exam doing well on Estrace 1 mg daily for vasomotor symptoms. Estrogen replacement therapy was started in 2015. We discussed again the women's health initiative study as well as a risk benefits and pros and cons of estrogen replacement therapy. Rx sent

## 2015-03-30 ENCOUNTER — Other Ambulatory Visit: Payer: Self-pay

## 2015-03-30 DIAGNOSIS — Z1231 Encounter for screening mammogram for malignant neoplasm of breast: Secondary | ICD-10-CM

## 2015-04-03 ENCOUNTER — Ambulatory Visit
Admission: RE | Admit: 2015-04-03 | Discharge: 2015-04-03 | Disposition: A | Discharge status: Home / Self Care | Payer: BLUE CROSS/BLUE SHIELD | Source: Ambulatory Visit

## 2015-04-03 DIAGNOSIS — Z1231 Encounter for screening mammogram for malignant neoplasm of breast: Secondary | ICD-10-CM

## 2015-11-15 ENCOUNTER — Encounter: Payer: BLUE CROSS/BLUE SHIELD | Admitting: Gynecology

## 2016-01-04 ENCOUNTER — Ambulatory Visit (INDEPENDENT_AMBULATORY_CARE_PROVIDER_SITE_OTHER): Payer: BLUE CROSS/BLUE SHIELD | Admitting: Gynecology

## 2016-01-04 ENCOUNTER — Encounter: Payer: BLUE CROSS/BLUE SHIELD | Admitting: Gynecology

## 2016-01-04 ENCOUNTER — Encounter: Payer: Self-pay | Admitting: Gynecology

## 2016-01-04 VITALS — BP 124/76 | Ht 65.0 in | Wt 158.8 lb

## 2016-01-04 DIAGNOSIS — N760 Acute vaginitis: Secondary | ICD-10-CM | POA: Diagnosis not present

## 2016-01-04 DIAGNOSIS — Z01419 Encounter for gynecological examination (general) (routine) without abnormal findings: Secondary | ICD-10-CM

## 2016-01-04 DIAGNOSIS — N898 Other specified noninflammatory disorders of vagina: Secondary | ICD-10-CM | POA: Diagnosis not present

## 2016-01-04 DIAGNOSIS — A499 Bacterial infection, unspecified: Secondary | ICD-10-CM

## 2016-01-04 DIAGNOSIS — B9689 Other specified bacterial agents as the cause of diseases classified elsewhere: Secondary | ICD-10-CM

## 2016-01-04 LAB — WET PREP FOR TRICH, YEAST, CLUE
CLUE CELLS WET PREP: NONE SEEN
Trich, Wet Prep: NONE SEEN
WBC WET PREP: NONE SEEN
Yeast Wet Prep HPF POC: NONE SEEN

## 2016-01-04 LAB — CBC WITH DIFFERENTIAL/PLATELET
BASOS PCT: 0 %
Basophils Absolute: 0 cells/uL (ref 0–200)
EOS PCT: 1 %
Eosinophils Absolute: 41 cells/uL (ref 15–500)
HCT: 45.6 % — ABNORMAL HIGH (ref 35.0–45.0)
HEMOGLOBIN: 15.1 g/dL (ref 11.7–15.5)
Lymphocytes Relative: 52 %
Lymphs Abs: 2132 cells/uL (ref 850–3900)
MCH: 31.1 pg (ref 27.0–33.0)
MCHC: 33.1 g/dL (ref 32.0–36.0)
MCV: 94 fL (ref 80.0–100.0)
MPV: 12.1 fL (ref 7.5–12.5)
Monocytes Absolute: 287 cells/uL (ref 200–950)
Monocytes Relative: 7 %
NEUTROS PCT: 40 %
Neutro Abs: 1640 cells/uL (ref 1500–7800)
Platelets: 187 10*3/uL (ref 140–400)
RBC: 4.85 MIL/uL (ref 3.80–5.10)
RDW: 14.1 % (ref 11.0–15.0)
WBC: 4.1 10*3/uL (ref 3.8–10.8)

## 2016-01-04 LAB — LIPID PANEL
CHOL/HDL RATIO: 2.1 ratio (ref <=5.0)
Cholesterol: 233 mg/dL — ABNORMAL HIGH (ref 125–200)
HDL: 109 mg/dL (ref >=46)
LDL Cholesterol: 109 mg/dL (ref <130)
Triglycerides: 76 mg/dL (ref <150)
VLDL: 15 mg/dL (ref <30)

## 2016-01-04 LAB — COMPREHENSIVE METABOLIC PANEL
ALBUMIN: 4.3 g/dL (ref 3.6–5.1)
ALK PHOS: 81 U/L (ref 33–130)
ALT: 20 U/L (ref 6–29)
AST: 24 U/L (ref 10–35)
BILIRUBIN TOTAL: 0.7 mg/dL (ref 0.2–1.2)
BUN: 14 mg/dL (ref 7–25)
CO2: 27 mmol/L (ref 20–31)
CREATININE: 0.93 mg/dL (ref 0.50–1.05)
Calcium: 9.7 mg/dL (ref 8.6–10.4)
Chloride: 100 mmol/L (ref 98–110)
GLUCOSE: 78 mg/dL (ref 65–99)
Potassium: 4.2 mmol/L (ref 3.5–5.3)
SODIUM: 136 mmol/L (ref 135–146)
Total Protein: 7.4 g/dL (ref 6.1–8.1)

## 2016-01-04 LAB — TSH: TSH: 1.32 mIU/L

## 2016-01-04 MED ORDER — ESTRADIOL 1 MG PO TABS: 1.0000 mg | ORAL_TABLET | Freq: Every day | ORAL | Status: DC

## 2016-01-04 MED ORDER — TINIDAZOLE 500 MG PO TABS: ORAL_TABLET | ORAL | Status: DC

## 2016-01-04 NOTE — Patient Instructions (Signed)
Tinidazole tablets What is this medicine? TINIDAZOLE (tye NI da zole) is an antiinfective. It is used to treat amebiasis, giardiasis, trichomoniasis, and vaginosis. It will not work for colds, flu, or other viral infections. This medicine may be used for other purposes; ask your health care provider or pharmacist if you have questions. What should I tell my health care provider before I take this medicine? They need to know if you have any of these conditions: -anemia or other blood disorders -if you frequently drink alcohol containing drinks -receiving hemodialysis -seizure disorder -an unusual or allergic reaction to tinidazole, other medicines, foods, dyes, or preservatives -pregnant or trying to get pregnant -breast-feeding How should I use this medicine? Take this medicine by mouth with a full glass of water. Follow the directions on the prescription label. Take with food. Take your medicine at regular intervals. Do not take your medicine more often than directed. Take all of your medicine as directed even if you think you are better. Do not skip doses or stop your medicine early. Talk to your pediatrician regarding the use of this medicine in children. While this drug may be prescribed for children as young as 35 years of age for selected conditions, precautions do apply. Overdosage: If you think you have taken too much of this medicine contact a poison control center or emergency room at once. NOTE: This medicine is only for you. Do not share this medicine with others. What if I miss a dose? If you miss a dose, take it as soon as you can. If it is almost time for your next dose, take only that dose. Do not take double or extra doses. What may interact with this medicine? Do not take this medicine with any of the following medications: -alcohol or any product that contains alcohol -amprenavir oral solution -disulfiram -paclitaxel injection -ritonavir oral solution -sertraline oral  solution -sulfamethoxazole-trimethoprim injection This medicine may also interact with the following medications: -cholestyramine -cimetidine -conivaptan -cyclosporin -fluorouracil -fosphenytoin, phenytoin -ketoconazole -lithium -phenobarbital -tacrolimus -warfarin This list may not describe all possible interactions. Give your health care provider a list of all the medicines, herbs, non-prescription drugs, or dietary supplements you use. Also tell them if you smoke, drink alcohol, or use illegal drugs. Some items may interact with your medicine. What should I watch for while using this medicine? Tell your doctor or health care professional if your symptoms do not improve or if they get worse. Avoid alcoholic drinks while you are taking this medicine and for three days afterward. Alcohol may make you feel dizzy, sick, or flushed. If you are being treated for a sexually transmitted disease, avoid sexual contact until you have finished your treatment. Your sexual partner may also need treatment. What side effects may I notice from receiving this medicine? Side effects that you should report to your doctor or health care professional as soon as possible: -allergic reactions like skin rash, itching or hives, swelling of the face, lips, or tongue -breathing problems -confusion, depression -dark or white patches in the mouth -feeling faint or lightheaded, falls -fever, infection -numbness, tingling, pain or weakness in the hands or feet -pain when passing urine -seizures -unusually weak or tired -vaginal irritation or discharge -vomiting Side effects that usually do not require medical attention (report to your doctor or health care professional if they continue or are bothersome): -dark brown or reddish urine -diarrhea -headache -loss of appetite -metallic taste -nausea -stomach upset This list may not describe all possible side effects. Call  your doctor for medical advice about  side effects. You may report side effects to FDA at 1-800-FDA-1088. Where should I keep my medicine? Keep out of the reach of children. Store at room temperature between 15 and 30 degrees C (59 and 86 degrees F). Protect from light and moisture. Keep container tightly closed. Throw away any unused medicine after the expiration date. NOTE: This sheet is a summary. It may not cover all possible information. If you have questions about this medicine, talk to your doctor, pharmacist, or health care provider.    2016, Elsevier/Gold Standard. (2008-05-23 15:22:28) Bacterial Vaginosis Bacterial vaginosis is a vaginal infection that occurs when the normal balance of bacteria in the vagina is disrupted. It results from an overgrowth of certain bacteria. This is the most common vaginal infection in women of childbearing age. Treatment is important to prevent complications, especially in pregnant women, as it can cause a premature delivery. CAUSES  Bacterial vaginosis is caused by an increase in harmful bacteria that are normally present in smaller amounts in the vagina. Several different kinds of bacteria can cause bacterial vaginosis. However, the reason that the condition develops is not fully understood. RISK FACTORS Certain activities or behaviors can put you at an increased risk of developing bacterial vaginosis, including:  Having a new sex partner or multiple sex partners.  Douching.  Using an intrauterine device (IUD) for contraception. Women do not get bacterial vaginosis from toilet seats, bedding, swimming pools, or contact with objects around them. SIGNS AND SYMPTOMS  Some women with bacterial vaginosis have no signs or symptoms. Common symptoms include:  Grey vaginal discharge.  A fishlike odor with discharge, especially after sexual intercourse.  Itching or burning of the vagina and vulva.  Burning or pain with urination. DIAGNOSIS  Your health care provider will take a medical  history and examine the vagina for signs of bacterial vaginosis. A sample of vaginal fluid may be taken. Your health care provider will look at this sample under a microscope to check for bacteria and abnormal cells. A vaginal pH test may also be done.  TREATMENT  Bacterial vaginosis may be treated with antibiotic medicines. These may be given in the form of a pill or a vaginal cream. A second round of antibiotics may be prescribed if the condition comes back after treatment. Because bacterial vaginosis increases your risk for sexually transmitted diseases, getting treated can help reduce your risk for chlamydia, gonorrhea, HIV, and herpes. HOME CARE INSTRUCTIONS   Only take over-the-counter or prescription medicines as directed by your health care provider.  If antibiotic medicine was prescribed, take it as directed. Make sure you finish it even if you start to feel better.  Tell all sexual partners that you have a vaginal infection. They should see their health care provider and be treated if they have problems, such as a mild rash or itching.  During treatment, it is important that you follow these instructions:  Avoid sexual activity or use condoms correctly.  Do not douche.  Avoid alcohol as directed by your health care provider.  Avoid breastfeeding as directed by your health care provider. SEEK MEDICAL CARE IF:   Your symptoms are not improving after 3 days of treatment.  You have increased discharge or pain.  You have a fever. MAKE SURE YOU:   Understand these instructions.  Will watch your condition.  Will get help right away if you are not doing well or get worse. FOR MORE INFORMATION  Centers for Disease  Control and Prevention, Division of STD Prevention: AppraiserFraud.fi American Sexual Health Association (ASHA): www.ashastd.org    This information is not intended to replace advice given to you by your health care provider. Make sure you discuss any questions you have  with your health care provider.   Document Released: 08/26/2005 Document Revised: 09/16/2014 Document Reviewed: 04/07/2013 Elsevier Interactive Patient Education [employer].

## 2016-01-04 NOTE — Addendum Note (Signed)
Addended by: Thurnell Garbe A on: 01/04/2016 12:05 PM   Modules accepted: Orders, SmartSet

## 2016-01-04 NOTE — Progress Notes (Signed)
Cassandra Vaughn 03-Sep-1966 [id number]   History:    50 y.o.  for annual gyn exam with the only complaint of experiencing a slight discharge with odor. Patient is a monogamous relationship. Review of patient's record indicated in 2015 as a result of symptomatic leiomyomatous uteri she had a total abdominal hysterectomy with bilateral salpingectomy.in April 2015 as a result of her vasomotor symptoms and after confirmation of elevated [place] she was started on Estrace 1 mg by mouth daily for vasomotor symptoms. She has done well. Also review of her record indicated in in 1996 she had LEEP cervical conization for dysplasia and subsequent Pap smears have been normal. Patient also has a sister with breast cancer and patient herself has been tested with negative BRCA1 and BRCA2 gene mutation not having been detected.patient also has a strong family history of diabetes (father).  Past medical history,surgical history, family history and social history were all reviewed and documented in the EPIC chart.  Gynecologic History Patient's last menstrual period was 09/28/2013. Contraception: status post hysterectomy Last Pap: 2016. Results were: normal Last mammogram: 2016. Results were: normal  Obstetric History OB History  Gravida Para Term Preterm AB SAB TAB Ectopic Multiple Living  2 0   2     0    # Outcome Date GA Lbr Len/2nd Weight Sex Delivery Anes PTL Lv  2 AB           1 AB                ROS: A ROS was performed and pertinent positives and negatives are included in the history.  GENERAL: No fevers or chills. HEENT: No change in vision, no earache, sore throat or sinus congestion. NECK: No pain or stiffness. CARDIOVASCULAR: No chest pain or pressure. No palpitations. PULMONARY: No shortness of breath, cough or wheeze. GASTROINTESTINAL: No abdominal pain, nausea, vomiting or diarrhea, melena or bright red blood per rectum. GENITOURINARY: No urinary frequency, urgency, hesitancy or dysuria.  MUSCULOSKELETAL: No joint or muscle pain, no back pain, no recent trauma. DERMATOLOGIC: No rash, no itching, no lesions. ENDOCRINE: No polyuria, polydipsia, no heat or cold intolerance. No recent change in weight. HEMATOLOGICAL: No anemia or easy bruising or bleeding. NEUROLOGIC: No headache, seizures, numbness, tingling or weakness. PSYCHIATRIC: No depression, no loss of interest in normal activity or change in sleep pattern.     Exam: chaperone present  BP 124/76 mmHg  Ht _0  (1.651 m)  Wt 158 lb 12.8 oz (72.031 kg)  BMI 26.43 kg/m2  LMP 09/28/2013  Body mass index is 26.43 kg/(m^2).  General appearance : Well developed well nourished female. No acute distress HEENT: Eyes: no retinal hemorrhage or exudates,  Neck supple, trachea midline, no carotid bruits, no thyroidmegaly Lungs: Clear to auscultation, no rhonchi or wheezes, or rib retractions  Heart: Regular rate and rhythm, no murmurs or gallops Breast:Examined in sitting and supine position were symmetrical in appearance, no palpable masses or tenderness,  no skin retraction, no nipple inversion, no nipple discharge, no skin discoloration, no axillary or supraclavicular lymphadenopathy Abdomen: no palpable masses or tenderness, no rebound or guarding Extremities: no edema or skin discoloration or tenderness  Pelvic:  Bartholin, Urethra, Skene Glands: Within normal limits             Vagina: No gross lesions or discharge  Cervix:Absent  Uterus  absent  Adnexa  Without masses or tenderness  Anus and perineum  normal   Rectovaginal  normal sphincter tone  without palpated masses or tenderness             Hemoccult not indicated   Wet prep moderate bacteria  Assessment/Plan:  50 y.o. female for annual exam will be treated for suspected bacterial vaginosis with Tindamax 500 mg tablets. Patient is a 40 absent and repeat in 24 hours. Because of her history of dysplasia in the past other she's had a hysterectomy we will continue to do  a Pap smear every year such as today. The following screening blood work was ordered today: Fasting lipid profile, comprehensive metabolic panel, CBC, TSH, and urinalysis. Patient was reminded to schedule her three-dimensional mammogram later this year. We discussed importance of monthly self breast examination. Patient was also reminded to get her screening colonoscopy this year since she is 50 years of age.  Terrance Mass MD, 11:52 AM 01/04/2016

## 2016-01-05 LAB — URINALYSIS W MICROSCOPIC + REFLEX CULTURE
BILIRUBIN URINE: NEGATIVE
Bacteria, UA: NONE SEEN [HPF]
Casts: NONE SEEN [LPF]
Crystals: NONE SEEN [HPF]
Glucose, UA: NEGATIVE
Hgb urine dipstick: NEGATIVE
KETONES UR: NEGATIVE
Leukocytes, UA: NEGATIVE
NITRITE: NEGATIVE
PH: 6 (ref 5.0–8.0)
Protein, ur: NEGATIVE
RBC / HPF: NONE SEEN RBC/HPF (ref <=2)
SPECIFIC GRAVITY, URINE: 1.013 (ref 1.001–1.035)
WBC UA: NONE SEEN WBC/HPF (ref <=5)
Yeast: NONE SEEN [HPF]

## 2016-01-05 LAB — PAP IG W/ RFLX HPV ASCU

## 2016-03-28 ENCOUNTER — Other Ambulatory Visit: Payer: Self-pay | Admitting: Gynecology

## 2016-03-28 DIAGNOSIS — Z1231 Encounter for screening mammogram for malignant neoplasm of breast: Secondary | ICD-10-CM

## 2016-04-11 ENCOUNTER — Ambulatory Visit
Admission: RE | Admit: 2016-04-11 | Discharge: 2016-04-11 | Disposition: A | Discharge status: Home / Self Care | Payer: BLUE CROSS/BLUE SHIELD | Source: Ambulatory Visit | Attending: Gynecology | Admitting: Gynecology

## 2016-04-11 DIAGNOSIS — Z1231 Encounter for screening mammogram for malignant neoplasm of breast: Secondary | ICD-10-CM

## 2016-04-16 ENCOUNTER — Encounter (HOSPITAL_COMMUNITY): Payer: Self-pay | Admitting: Emergency Medicine

## 2016-04-16 ENCOUNTER — Ambulatory Visit (HOSPITAL_COMMUNITY)
Admission: EM | Admit: 2016-04-16 | Discharge: 2016-04-16 | Disposition: A | Discharge status: Home / Self Care | Payer: BLUE CROSS/BLUE SHIELD

## 2016-04-16 DIAGNOSIS — S39012A Strain of muscle, fascia and tendon of lower back, initial encounter: Secondary | ICD-10-CM
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
## 2016-07-03 ENCOUNTER — Ambulatory Visit: Payer: BLUE CROSS/BLUE SHIELD | Admitting: Gynecology

## 2016-07-04 ENCOUNTER — Ambulatory Visit (INDEPENDENT_AMBULATORY_CARE_PROVIDER_SITE_OTHER): Payer: BLUE CROSS/BLUE SHIELD | Admitting: Women's Health

## 2016-07-04 ENCOUNTER — Encounter: Payer: Self-pay | Admitting: Women's Health

## 2016-07-04 ENCOUNTER — Ambulatory Visit: Payer: BLUE CROSS/BLUE SHIELD | Admitting: Gynecology

## 2016-07-04 VITALS — BP 110/78 | Ht 65.0 in

## 2016-07-04 DIAGNOSIS — N898 Other specified noninflammatory disorders of vagina: Secondary | ICD-10-CM

## 2016-07-04 DIAGNOSIS — R350 Frequency of micturition: Secondary | ICD-10-CM | POA: Diagnosis not present

## 2016-07-04 DIAGNOSIS — B9689 Other specified bacterial agents as the cause of diseases classified elsewhere: Secondary | ICD-10-CM

## 2016-07-04 DIAGNOSIS — N760 Acute vaginitis: Secondary | ICD-10-CM | POA: Diagnosis not present

## 2016-07-04 LAB — WET PREP FOR TRICH, YEAST, CLUE
TRICH WET PREP: NONE SEEN
Yeast Wet Prep HPF POC: NONE SEEN

## 2016-07-04 LAB — URINALYSIS W MICROSCOPIC + REFLEX CULTURE
BILIRUBIN URINE: NEGATIVE
Bacteria, UA: NONE SEEN [HPF]
Casts: NONE SEEN [LPF]
Crystals: NONE SEEN [HPF]
GLUCOSE, UA: NEGATIVE
Hgb urine dipstick: NEGATIVE
Ketones, ur: NEGATIVE
LEUKOCYTES UA: NEGATIVE
NITRITE: NEGATIVE
Protein, ur: NEGATIVE
RBC / HPF: NONE SEEN RBC/HPF (ref <=2)
SPECIFIC GRAVITY, URINE: 1.015 (ref 1.001–1.035)
WBC UA: NONE SEEN WBC/HPF (ref <=5)
YEAST: NONE SEEN [HPF]
pH: 7 (ref 5.0–8.0)

## 2016-07-04 MED ORDER — METRONIDAZOLE 500 MG PO TABS: 500.0000 mg | ORAL_TABLET | Freq: Two times a day (BID) | ORAL | 0 refills | Status: DC

## 2016-07-04 NOTE — Progress Notes (Signed)
Presents with complaint of increased vaginal discharge with mild itching and odor and increased urinary frequency for the past several days. Denies pain or burning with urination, pain at the end of stream of urination, abdominal pain or fever.  2015 TAH with BS on Estrace.   Exam: Appears well. Abdomen soft, nontender. External genitalia within normal limits, speculum exam scant white discharge without odor, wet prep positive for clues, TNTC bacteria. UA: Negative and negatives microscopic.  Bacteria vaginosis  Plan: Flagyl 500 twice daily for 7 days, alcohol precautions reviewed. Instructed to call if no relief of discharge.

## 2016-07-04 NOTE — Patient Instructions (Signed)
bv Bacterial Vaginosis Bacterial vaginosis is a vaginal infection that occurs when the normal balance of bacteria in the vagina is disrupted. It results from an overgrowth of certain bacteria. This is the most common vaginal infection in women of childbearing age. Treatment is important to prevent complications, especially in pregnant women, as it can cause a premature delivery. CAUSES  Bacterial vaginosis is caused by an increase in harmful bacteria that are normally present in smaller amounts in the vagina. Several different kinds of bacteria can cause bacterial vaginosis. However, the reason that the condition develops is not fully understood. RISK FACTORS Certain activities or behaviors can put you at an increased risk of developing bacterial vaginosis, including:  Having a new sex partner or multiple sex partners.  Douching.  Using an intrauterine device (IUD) for contraception. Women do not get bacterial vaginosis from toilet seats, bedding, swimming pools, or contact with objects around them. SIGNS AND SYMPTOMS  Some women with bacterial vaginosis have no signs or symptoms. Common symptoms include:  Grey vaginal discharge.  A fishlike odor with discharge, especially after sexual intercourse.  Itching or burning of the vagina and vulva.  Burning or pain with urination. DIAGNOSIS  Your health care provider will take a medical history and examine the vagina for signs of bacterial vaginosis. A sample of vaginal fluid may be taken. Your health care provider will look at this sample under a microscope to check for bacteria and abnormal cells. A vaginal pH test may also be done.  TREATMENT  Bacterial vaginosis may be treated with antibiotic medicines. These may be given in the form of a pill or a vaginal cream. A second round of antibiotics may be prescribed if the condition comes back after treatment. Because bacterial vaginosis increases your risk for sexually transmitted diseases,  getting treated can help reduce your risk for chlamydia, gonorrhea, HIV, and herpes. HOME CARE INSTRUCTIONS   Only take over-the-counter or prescription medicines as directed by your health care provider.  If antibiotic medicine was prescribed, take it as directed. Make sure you finish it even if you start to feel better.  Tell all sexual partners that you have a vaginal infection. They should see their health care provider and be treated if they have problems, such as a mild rash or itching.  During treatment, it is important that you follow these instructions:  Avoid sexual activity or use condoms correctly.  Do not douche.  Avoid alcohol as directed by your health care provider.  Avoid breastfeeding as directed by your health care provider. SEEK MEDICAL CARE IF:   Your symptoms are not improving after 3 days of treatment.  You have increased discharge or pain.  You have a fever. MAKE SURE YOU:   Understand these instructions.  Will watch your condition.  Will get help right away if you are not doing well or get worse. FOR MORE INFORMATION  Centers for Disease Control and Prevention, Division of STD Prevention: AppraiserFraud.fi American Sexual Health Association (ASHA): www.ashastd.org    This information is not intended to replace advice given to you by your health care provider. Make sure you discuss any questions you have with your health care provider.   Document Released: 08/26/2005 Document Revised: 09/16/2014 Document Reviewed: 04/07/2013 Elsevier Interactive Patient Education [employer].

## 2016-09-17 ENCOUNTER — Ambulatory Visit (INDEPENDENT_AMBULATORY_CARE_PROVIDER_SITE_OTHER): Payer: BLUE CROSS/BLUE SHIELD | Admitting: Women's Health

## 2016-09-17 ENCOUNTER — Encounter: Payer: Self-pay | Admitting: Women's Health

## 2016-09-17 VITALS — BP 124/80 | Ht 65.0 in | Wt 158.0 lb

## 2016-09-17 DIAGNOSIS — N760 Acute vaginitis: Secondary | ICD-10-CM

## 2016-09-17 DIAGNOSIS — N898 Other specified noninflammatory disorders of vagina: Secondary | ICD-10-CM | POA: Diagnosis not present

## 2016-09-17 DIAGNOSIS — R350 Frequency of micturition: Secondary | ICD-10-CM | POA: Diagnosis not present

## 2016-09-17 DIAGNOSIS — B9689 Other specified bacterial agents as the cause of diseases classified elsewhere: Secondary | ICD-10-CM

## 2016-09-17 LAB — URINALYSIS W MICROSCOPIC + REFLEX CULTURE
Bilirubin Urine: NEGATIVE
CASTS: NONE SEEN [LPF]
Crystals: NONE SEEN [HPF]
Glucose, UA: NEGATIVE
Hgb urine dipstick: NEGATIVE
KETONES UR: NEGATIVE
LEUKOCYTES UA: NEGATIVE
NITRITE: NEGATIVE
PH: 6.5 (ref 5.0–8.0)
RBC / HPF: NONE SEEN RBC/HPF (ref <=2)
SPECIFIC GRAVITY, URINE: 1.025 (ref 1.001–1.035)
YEAST: NONE SEEN [HPF]

## 2016-09-17 LAB — WET PREP FOR TRICH, YEAST, CLUE
Trich, Wet Prep: NONE SEEN
Yeast Wet Prep HPF POC: NONE SEEN

## 2016-09-17 MED ORDER — FLUCONAZOLE 150 MG PO TABS: 150.0000 mg | ORAL_TABLET | Freq: Once | ORAL | 1 refills | Status: AC

## 2016-09-17 MED ORDER — METRONIDAZOLE 500 MG PO TABS: 500.0000 mg | ORAL_TABLET | Freq: Two times a day (BID) | ORAL | 0 refills | Status: DC

## 2016-09-17 NOTE — Progress Notes (Signed)
Presented for vaginitis, possible yeast infection. Itching, burning, odor white curd-like discharge x 2 days. Denies dysuria, dysparenia, fever, and abdominal or pelvic pain.  Used Monistat prior to appointment mild relief of symptoms.Same partner.  Hysterectomy 2/15. No changes in hygiene products or detergents prior to onset.  Treated for bacterial vaginosis (10/17).  Received antibiotic treatment 3 weeks prior for URI.    Exam: Appears well. No erythema of vulva or labia minora.  Milky white discharge at the introitus, along vaginal wall vagina., Wet prep positive clues, few bacteria, TNTC bacteria.  UA: negative  Bacterial vagnosis  Plan: Prescribed Flagyl 500mg  x2/ day for 7days. Diflucan 150 mg single dose. Reviewed prevention techniques for vaginitis, adverse effects for flagyl and cessation of alcohol use during treatment. Instructed to call if no relief.

## 2016-09-17 NOTE — Patient Instructions (Signed)

## 2016-09-18 LAB — URINE CULTURE

## 2016-12-23 ENCOUNTER — Other Ambulatory Visit: Payer: Self-pay | Admitting: Gynecology

## 2017-01-06 ENCOUNTER — Encounter: Payer: BLUE CROSS/BLUE SHIELD | Admitting: Gynecology

## 2017-01-14 ENCOUNTER — Ambulatory Visit (INDEPENDENT_AMBULATORY_CARE_PROVIDER_SITE_OTHER): Payer: BLUE CROSS/BLUE SHIELD | Admitting: Gynecology

## 2017-01-14 ENCOUNTER — Encounter: Payer: Self-pay | Admitting: Gynecology

## 2017-01-14 VITALS — BP 124/72 | Ht 65.0 in | Wt 159.8 lb

## 2017-01-14 DIAGNOSIS — Z79890 Hormone replacement therapy (postmenopausal): Secondary | ICD-10-CM

## 2017-01-14 DIAGNOSIS — Z01419 Encounter for gynecological examination (general) (routine) without abnormal findings: Secondary | ICD-10-CM | POA: Diagnosis not present

## 2017-01-14 DIAGNOSIS — B9689 Other specified bacterial agents as the cause of diseases classified elsewhere: Secondary | ICD-10-CM

## 2017-01-14 DIAGNOSIS — Z780 Asymptomatic menopausal state: Secondary | ICD-10-CM

## 2017-01-14 DIAGNOSIS — N760 Acute vaginitis: Secondary | ICD-10-CM

## 2017-01-14 DIAGNOSIS — N898 Other specified noninflammatory disorders of vagina: Secondary | ICD-10-CM

## 2017-01-14 LAB — CBC WITH DIFFERENTIAL/PLATELET
BASOS ABS: 0 {cells}/uL (ref 0–200)
BASOS PCT: 0 %
EOS ABS: 42 {cells}/uL (ref 15–500)
Eosinophils Relative: 1 %
HEMATOCRIT: 45.6 % — AB (ref 35.0–45.0)
Hemoglobin: 14.9 g/dL (ref 11.7–15.5)
LYMPHS PCT: 42 %
Lymphs Abs: 1764 cells/uL (ref 850–3900)
MCH: 30.6 pg (ref 27.0–33.0)
MCHC: 32.7 g/dL (ref 32.0–36.0)
MCV: 93.6 fL (ref 80.0–100.0)
MONO ABS: 462 {cells}/uL (ref 200–950)
MPV: 11 fL (ref 7.5–12.5)
Monocytes Relative: 11 %
NEUTROS PCT: 46 %
Neutro Abs: 1932 cells/uL (ref 1500–7800)
Platelets: 226 10*3/uL (ref 140–400)
RBC: 4.87 MIL/uL (ref 3.80–5.10)
RDW: 13.9 % (ref 11.0–15.0)
WBC: 4.2 10*3/uL (ref 3.8–10.8)

## 2017-01-14 LAB — LIPID PANEL
CHOL/HDL RATIO: 2 ratio (ref <5.0)
Cholesterol: 231 mg/dL — ABNORMAL HIGH (ref <200)
HDL: 117 mg/dL (ref >50)
LDL Cholesterol: 101 mg/dL — ABNORMAL HIGH (ref <100)
Triglycerides: 67 mg/dL (ref <150)
VLDL: 13 mg/dL (ref <30)

## 2017-01-14 LAB — COMPREHENSIVE METABOLIC PANEL
ALK PHOS: 76 U/L (ref 33–130)
ALT: 16 U/L (ref 6–29)
AST: 22 U/L (ref 10–35)
Albumin: 4 g/dL (ref 3.6–5.1)
BUN: 15 mg/dL (ref 7–25)
CALCIUM: 9.6 mg/dL (ref 8.6–10.4)
CHLORIDE: 104 mmol/L (ref 98–110)
CO2: 24 mmol/L (ref 20–31)
Creat: 0.99 mg/dL (ref 0.50–1.05)
GLUCOSE: 79 mg/dL (ref 65–99)
POTASSIUM: 4.5 mmol/L (ref 3.5–5.3)
Sodium: 140 mmol/L (ref 135–146)
Total Bilirubin: 0.8 mg/dL (ref 0.2–1.2)
Total Protein: 7.3 g/dL (ref 6.1–8.1)

## 2017-01-14 LAB — WET PREP FOR TRICH, YEAST, CLUE
Trich, Wet Prep: NONE SEEN
Yeast Wet Prep HPF POC: NONE SEEN

## 2017-01-14 MED ORDER — TINIDAZOLE 500 MG PO TABS: ORAL_TABLET | ORAL | 0 refills | Status: DC

## 2017-01-14 MED ORDER — METRONIDAZOLE 0.75 % VA GEL: VAGINAL | 3 refills | Status: DC

## 2017-01-14 MED ORDER — ESTRADIOL 1 MG PO TABS: 1.0000 mg | ORAL_TABLET | Freq: Every day | ORAL | 4 refills | Status: DC

## 2017-01-14 NOTE — Patient Instructions (Addendum)
Colonoscopy, Adult A colonoscopy is an exam to look at the entire large intestine. During the exam, a lubricated, bendable tube is inserted into the anus and then passed into the rectum, colon, and other parts of the large intestine. A colonoscopy is often done as a part of normal colorectal screening or in response to certain symptoms, such as anemia, persistent diarrhea, abdominal pain, and blood in the stool. The exam can help screen for and diagnose medical problems, including:  Tumors.  Polyps.  Inflammation.  Areas of bleeding. Tell a health care provider about:  Any allergies you have.  All medicines you are taking, including vitamins, herbs, eye drops, creams, and over-the-counter medicines.  Any problems you or family members have had with anesthetic medicines.  Any blood disorders you have.  Any surgeries you have had.  Any medical conditions you have.  Any problems you have had passing stool. What are the risks? Generally, this is a safe procedure. However, problems may occur, including:  Bleeding.  A tear in the intestine.  A reaction to medicines given during the exam.  Infection (rare). What happens before the procedure? Eating and drinking restrictions  Follow instructions from your health care provider about eating and drinking, which may include:  A few days before the procedure - follow a low-fiber diet. Avoid nuts, seeds, dried fruit, raw fruits, and vegetables.  1-3 days before the procedure - follow a clear liquid diet. Drink only clear liquids, such as clear broth or bouillon, black coffee or tea, clear juice, clear soft drinks or sports drinks, gelatin dessert, and popsicles. Avoid any liquids that contain red or purple dye.  On the day of the procedure - do not eat or drink anything during the 2 hours before the procedure, or within the time period that your health care provider recommends. Bowel prep  If you were prescribed an oral bowel prep to  clean out your colon:  Take it as told by your health care provider. Starting the day before your procedure, you will need to drink a large amount of medicated liquid. The liquid will cause you to have multiple loose stools until your stool is almost clear or light green.  If your skin or anus gets irritated from diarrhea, you may use these to relieve the irritation:  Medicated wipes, such as adult wet wipes with aloe and vitamin E.  A skin soothing-product like petroleum jelly.  If you vomit while drinking the bowel prep, take a break for up to 60 minutes and then begin the bowel prep again. If vomiting continues and you cannot take the bowel prep without vomiting, call your health care provider. General instructions   Ask your health care provider about changing or stopping your regular medicines. This is especially important if you are taking diabetes medicines or blood thinners.  Plan to have someone take you home from the hospital or clinic. What happens during the procedure?  An IV tube may be inserted into one of your veins.  You will be given medicine to help you relax (sedative).  To reduce your risk of infection:  Your health care team will wash or sanitize their hands.  Your anal area will be washed with soap.  You will be asked to lie on your side with your knees bent.  Your health care provider will lubricate a long, thin, flexible tube. The tube will have a camera and a light on the end.  The tube will be inserted into your anus.    The tube will be gently eased through your rectum and colon.  Air will be delivered into your colon to keep it open. You may feel some pressure or cramping.  The camera will be used to take images during the procedure.  A small tissue sample may be removed from your body to be examined under a microscope (biopsy). If any potential problems are found, the tissue will be sent to a lab for testing.  If small polyps are found, your health  care provider may remove them and have them checked for cancer cells.  The tube that was inserted into your anus will be slowly removed. The procedure may vary among health care providers and hospitals. What happens after the procedure?  Your blood pressure, heart rate, breathing rate, and blood oxygen level will be monitored until the medicines you were given have worn off.  Do not drive for 24 hours after the exam.  You may have a small amount of blood in your stool.  You may pass gas and have mild abdominal cramping or bloating due to the air that was used to inflate your colon during the exam.  It is up to you to get the results of your procedure. Ask your health care provider, or the department performing the procedure, when your results will be ready. This information is not intended to replace advice given to you by your health care provider. Make sure you discuss any questions you have with your health care provider. Document Released: 08/23/2000 Document Revised: 06/26/2016 Document Reviewed: 11/07/2015 Elsevier Interactive Patient Education  2017 [employer] Tinidazole tablets What is this medicine? TINIDAZOLE (tye NI da zole) is an antiinfective. It is used to treat amebiasis, giardiasis, trichomoniasis, and vaginosis. It will not work for colds, flu, or other viral infections. This medicine may be used for other purposes; ask your health care provider or pharmacist if you have questions. COMMON BRAND NAME(S): Tindamax What should I tell my health care provider before I take this medicine? They need to know if you have any of these conditions: -anemia or other blood disorders -if you frequently drink alcohol containing drinks -receiving hemodialysis -seizure disorder -an unusual or allergic reaction to tinidazole, other medicines, foods, dyes, or preservatives -pregnant or trying to get pregnant -breast-feeding How should I use this medicine? Take this medicine by mouth  with a full glass of water. Follow the directions on the prescription label. Take with food. Take your medicine at regular intervals. Do not take your medicine more often than directed. Take all of your medicine as directed even if you think you are better. Do not skip doses or stop your medicine early. Talk to your pediatrician regarding the use of this medicine in children. While this drug may be prescribed for children as young as 79 years of age for selected conditions, precautions do apply. Overdosage: If you think you have taken too much of this medicine contact a poison control center or emergency room at once. NOTE: This medicine is only for you. Do not share this medicine with others. What if I miss a dose? If you miss a dose, take it as soon as you can. If it is almost time for your next dose, take only that dose. Do not take double or extra doses. What may interact with this medicine? Do not take this medicine with any of the following medications: -alcohol or any product that contains alcohol -amprenavir oral solution -disulfiram -paclitaxel injection -ritonavir oral solution -sertraline oral solution -  sulfamethoxazole-trimethoprim injection This medicine may also interact with the following medications: -cholestyramine -cimetidine -conivaptan -cyclosporin -fluorouracil -fosphenytoin, phenytoin -ketoconazole -lithium -phenobarbital -tacrolimus -warfarin This list may not describe all possible interactions. Give your health care provider a list of all the medicines, herbs, non-prescription drugs, or dietary supplements you use. Also tell them if you smoke, drink alcohol, or use illegal drugs. Some items may interact with your medicine. What should I watch for while using this medicine? Tell your doctor or health care professional if your symptoms do not improve or if they get worse. Avoid alcoholic drinks while you are taking this medicine and for three days afterward. Alcohol  may make you feel dizzy, sick, or flushed. If you are being treated for a sexually transmitted disease, avoid sexual contact until you have finished your treatment. Your sexual partner may also need treatment. What side effects may I notice from receiving this medicine? Side effects that you should report to your doctor or health care professional as soon as possible: -allergic reactions like skin rash, itching or hives, swelling of the face, lips, or tongue -breathing problems -confusion, depression -dark or white patches in the mouth -feeling faint or lightheaded, falls -fever, infection -numbness, tingling, pain or weakness in the hands or feet -pain when passing urine -seizures -unusually weak or tired -vaginal irritation or discharge -vomiting Side effects that usually do not require medical attention (report to your doctor or health care professional if they continue or are bothersome): -dark brown or reddish urine -diarrhea -headache -loss of appetite -metallic taste -nausea -stomach upset This list may not describe all possible side effects. Call your doctor for medical advice about side effects. You may report side effects to FDA at 1-800-FDA-1088. Where should I keep my medicine? Keep out of the reach of children. Store at room temperature between 15 and 30 degrees C (59 and 86 degrees F). Protect from light and moisture. Keep container tightly closed. Throw away any unused medicine after the expiration date. NOTE: This sheet is a summary. It may not cover all possible information. If you have questions about this medicine, talk to your doctor, pharmacist, or health care provider.  2018 Elsevier/Gold Standard (2008-05-23 15:22:28)  Bacterial Vaginosis Bacterial vaginosis is a vaginal infection that occurs when the normal balance of bacteria in the vagina is disrupted. It results from an overgrowth of certain bacteria. This is the most common vaginal infection among women  ages 89-44. Because bacterial vaginosis increases your risk for STIs (sexually transmitted infections), getting treated can help reduce your risk for chlamydia, gonorrhea, herpes, and HIV (human immunodeficiency virus). Treatment is also important for preventing complications in pregnant women, because this condition can cause an early (premature) delivery. What are the causes? This condition is caused by an increase in harmful bacteria that are normally present in small amounts in the vagina. However, the reason that the condition develops is not fully understood. What increases the risk? The following factors may make you more likely to develop this condition:  Having a new sexual partner or multiple sexual partners.  Having unprotected sex.  Douching.  Having an intrauterine device (IUD).  Smoking.  Drug and alcohol abuse.  Taking certain antibiotic medicines.  Being pregnant. You cannot get bacterial vaginosis from toilet seats, bedding, swimming pools, or contact with objects around you. What are the signs or symptoms? Symptoms of this condition include:  Grey or white vaginal discharge. The discharge can also be watery or foamy.  A fish-like odor with discharge,  especially after sexual intercourse or during menstruation.  Itching in and around the vagina.  Burning or pain with urination. Some women with bacterial vaginosis have no signs or symptoms. How is this diagnosed? This condition is diagnosed based on:  Your medical history.  A physical exam of the vagina.  Testing a sample of vaginal fluid under a microscope to look for a large amount of bad bacteria or abnormal cells. Your health care provider may use a cotton swab or a small wooden spatula to collect the sample. How is this treated? This condition is treated with antibiotics. These may be given as a pill, a vaginal cream, or a medicine that is put into the vagina (suppository). If the condition comes back  after treatment, a second round of antibiotics may be needed. Follow these instructions at home: Medicines   Take over-the-counter and prescription medicines only as told by your health care provider.  Take or use your antibiotic as told by your health care provider. Do not stop taking or using the antibiotic even if you start to feel better. General instructions   If you have a female sexual partner, tell her that you have a vaginal infection. She should see her health care provider and be treated if she has symptoms. If you have a female sexual partner, he does not need treatment.  During treatment:  Avoid sexual activity until you finish treatment.  Do not douche.  Avoid alcohol as directed by your health care provider.  Avoid breastfeeding as directed by your health care provider.  Drink enough water and fluids to keep your urine clear or pale yellow.  Keep the area around your vagina and rectum clean.  Wash the area daily with warm water.  Wipe yourself from front to back after using the toilet.  Keep all follow-up visits as told by your health care provider. This is important. How is this prevented?  Do not douche.  Wash the outside of your vagina with warm water only.  Use protection when having sex. This includes latex condoms and dental dams.  Limit how many sexual partners you have. To help prevent bacterial vaginosis, it is best to have sex with just one partner (monogamous).  Make sure you and your sexual partner are tested for STIs.  Wear cotton or cotton-lined underwear.  Avoid wearing tight pants and pantyhose, especially during summer.  Limit the amount of alcohol that you drink.  Do not use any products that contain nicotine or tobacco, such as cigarettes and e-cigarettes. If you need help quitting, ask your health care provider.  Do not use illegal drugs. Where to find more information:  Centers for Disease Control and Prevention:  AppraiserFraud.fi  American Sexual Health Association (ASHA): www.ashastd.org  [place] Department of Health and Financial controller, Office on Women's Health: DustingSprays.pl or SecuritiesCard.it Contact a health care provider if:  Your symptoms do not improve, even after treatment.  You have more discharge or pain when urinating.  You have a fever.  You have pain in your abdomen.  You have pain during sex.  You have vaginal bleeding between periods. Summary  Bacterial vaginosis is a vaginal infection that occurs when the normal balance of bacteria in the vagina is disrupted.  Because bacterial vaginosis increases your risk for STIs (sexually transmitted infections), getting treated can help reduce your risk for chlamydia, gonorrhea, herpes, and HIV (human immunodeficiency virus). Treatment is also important for preventing complications in pregnant women, because the condition can cause an early (  premature) delivery.  This condition is treated with antibiotic medicines. These may be given as a pill, a vaginal cream, or a medicine that is put into the vagina (suppository). This information is not intended to replace advice given to you by your health care provider. Make sure you discuss any questions you have with your health care provider. Document Released: 08/26/2005 Document Revised: 05/11/2016 Document Reviewed: 05/11/2016 Elsevier Interactive Patient Education  2017 [employer].

## 2017-01-14 NOTE — Progress Notes (Signed)
Cassandra Vaughn 1965-12-22 [id number]   History:    51 y.o.  for annual gyn exam with a complaint of vaginal discharge with fishy odor. 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 She has done well. Also review of her record indicated in in 1996 she had LEEP cervical conization for dysplasia and subsequent Pap smears have been normal. Patient also has a sister with breast cancer and patient herself has been tested with negative BRCA1 and BRCA2 gene mutation not having been detected.patient also has a strong family history of diabetes (father).  Past medical history,surgical history, family history and social history were all reviewed and documented in the EPIC chart.  Gynecologic History Patient's last menstrual period was 09/28/2013. Contraception: status post hysterectomy Last Pap:  2017. Results were: normal Last mammogram:  2017. Results were: normal  Obstetric History OB History  Gravida Para Term Preterm AB Living  2 0     2 0  SAB TAB Ectopic Multiple Live Births               # Outcome Date GA Lbr Len/2nd Weight Sex Delivery Anes PTL Lv  2 AB           1 AB                ROS: A ROS was performed and pertinent positives and negatives are included in the history.  GENERAL: No fevers or chills. HEENT: No change in vision, no earache, sore throat or sinus congestion. NECK: No pain or stiffness. CARDIOVASCULAR: No chest pain or pressure. No palpitations. PULMONARY: No shortness of breath, cough or wheeze. GASTROINTESTINAL: No abdominal pain, nausea, vomiting or diarrhea, melena or bright red blood per rectum. GENITOURINARY: No urinary frequency, urgency, hesitancy or  dysuria. MUSCULOSKELETAL: No joint or muscle pain, no back pain, no recent trauma. DERMATOLOGIC: No rash, no itching, no lesions. ENDOCRINE: No polyuria, polydipsia, no heat or cold intolerance. No recent change in weight. HEMATOLOGICAL: No anemia or easy bruising or bleeding. NEUROLOGIC: No headache, seizures, numbness, tingling or weakness. PSYCHIATRIC: No depression, no loss of interest in normal activity or change in sleep pattern.     Exam: chaperone present  BP 124/72   Ht _0  (1.651 m)   Wt 159 lb 12.8 oz (72.5 kg)   LMP 09/28/2013   BMI 26.59 kg/m   Body mass index is 26.59 kg/m.  General appearance : Well developed well nourished female. No acute distress HEENT: Eyes: no retinal hemorrhage or exudates,  Neck supple, trachea midline, no carotid bruits, no thyroidmegaly Lungs: Clear to auscultation, no rhonchi or wheezes, or rib retractions  Heart: Regular rate and rhythm, no murmurs or gallops Breast:Examined in sitting and supine position were symmetrical in appearance, no palpable masses or tenderness,  no skin retraction, no nipple inversion, no nipple discharge, no skin discoloration, no axillary or supraclavicular lymphadenopathy Abdomen: no palpable masses or tenderness, no rebound or guarding Extremities: no edema or skin discoloration or tenderness  Pelvic:  Bartholin, Urethra, Skene Glands: Within normal limits             Vagina: No gross lesions or discharge  Cervix:  Absent  Uterus  Absent  Adnexa  Without masses or tenderness  Anus and perineum  normal   Rectovaginal  normal sphincter tone without palpated masses or tenderness             Hemoccult  patient to schedule colonoscopy this year   Wet prep moderate clue cells, moderate white blood cell, too numerous to count bacteria  Assessment/Plan:  51 y.o. female for annual exam  with recurrent bacterial vaginosis she had similar infection in October 2017 in January 2018. She will be prescribed Tindamax 500  mg she will take 4 tablets today repeat in 24 hours. She will start MetroGel vaginal cream to apply intravaginally twice a week for the next one to 2 months because of the recurrence. Because of her history of LEEP conization due to dysplasia a Pap smear was done today. Patient was also provided with negative community gastroenterologist for her to schedule colonoscopy.   Terrance Mass MD, 10:33 AM 01/14/2017

## 2017-01-16 LAB — PAP IG W/ RFLX HPV ASCU

## 2017-01-20 ENCOUNTER — Encounter: Payer: Self-pay | Admitting: Gastroenterology

## 2017-01-22 ENCOUNTER — Encounter: Payer: Self-pay | Admitting: Gynecology

## 2017-02-11 ENCOUNTER — Encounter: Payer: Self-pay | Admitting: Gastroenterology

## 2017-02-11 ENCOUNTER — Ambulatory Visit (AMBULATORY_SURGERY_CENTER): Payer: Self-pay

## 2017-02-11 VITALS — Ht 65.0 in | Wt 159.4 lb

## 2017-02-11 DIAGNOSIS — Z1211 Encounter for screening for malignant neoplasm of colon: Secondary | ICD-10-CM

## 2017-02-11 MED ORDER — NA SULFATE-K SULFATE-MG SULF 17.5-3.13-1.6 GM/177ML PO SOLN: ORAL | 0 refills | Status: DC

## 2017-02-11 NOTE — Progress Notes (Signed)
Per pt, no allergies to soy or egg products.Pt not taking any weight loss meds or using  O2 at home.   Pt refused Emmi video. 

## 2017-02-25 ENCOUNTER — Encounter: Payer: Self-pay | Admitting: Gastroenterology

## 2017-02-25 ENCOUNTER — Ambulatory Visit (AMBULATORY_SURGERY_CENTER): Payer: BLUE CROSS/BLUE SHIELD | Admitting: Gastroenterology

## 2017-02-25 VITALS — BP 114/71 | HR 68 | Temp 97.8°F | Resp 13 | Ht 65.0 in | Wt 159.0 lb

## 2017-02-25 DIAGNOSIS — D123 Benign neoplasm of transverse colon: Secondary | ICD-10-CM

## 2017-02-25 DIAGNOSIS — Z1211 Encounter for screening for malignant neoplasm of colon: Secondary | ICD-10-CM | POA: Diagnosis not present

## 2017-02-25 DIAGNOSIS — Z1212 Encounter for screening for malignant neoplasm of rectum: Secondary | ICD-10-CM | POA: Diagnosis not present

## 2017-02-25 DIAGNOSIS — K6389 Other specified diseases of intestine: Secondary | ICD-10-CM | POA: Diagnosis not present

## 2017-02-25 DIAGNOSIS — K633 Ulcer of intestine: Secondary | ICD-10-CM

## 2017-02-25 MED ORDER — SODIUM CHLORIDE 0.9 % IV SOLN: 500.0000 mL | INTRAVENOUS | Status: DC

## 2017-02-25 NOTE — Op Note (Signed)
[facility] Patient Name: Cassandra Vaughn Procedure Date: 02/25/2017 1:18 PM MRN: [id number] Endoscopist: Mauri Pole , MD Age: 51 Referring MD:  Date of Birth: March 13, 1966 Gender: Female Account #: [id number] Procedure:                Colonoscopy Indications:              Screening for colorectal malignant neoplasm, This                            is the patient's first colonoscopy Medicines:                Monitored Anesthesia Care Procedure:                Pre-Anesthesia Assessment:                           - Prior to the procedure, a History and Physical                            was performed, and patient medications and                            allergies were reviewed. The patient's tolerance of                            previous anesthesia was also reviewed. The risks                            and benefits of the procedure and the sedation                            options and risks were discussed with the patient.                            All questions were answered, and informed consent                            was obtained. Prior Anticoagulants: The patient has                            taken no previous anticoagulant or antiplatelet                            agents. ASA Grade Assessment: II - A patient with                            mild systemic disease. After reviewing the risks                            and benefits, the patient was deemed in                            satisfactory condition to undergo the procedure.  After obtaining informed consent, the colonoscope                            was passed under direct vision. Throughout the                            procedure, the patient's blood pressure, pulse, and                            oxygen saturations were monitored continuously. The                            Model PCF-[id number]) scope was introduced                            through the anus  and advanced to the the terminal                            ileum, with identification of the appendiceal                            orifice and IC valve. The colonoscopy was performed                            without difficulty. The patient tolerated the                            procedure well. The quality of the bowel                            preparation was adequate. The terminal ileum,                            ileocecal valve, appendiceal orifice, and rectum                            were photographed. Scope In: 1:20:53 PM Scope Out: 1:43:50 PM Scope Withdrawal Time: 0 hours 18 minutes 21 seconds  Total Procedure Duration: 0 hours 22 minutes 57 seconds  Findings:                 The perianal and digital rectal examinations were                            normal.                           A 3 mm polyp was found in the transverse colon. The                            polyp was sessile. The polyp was removed with a                            cold biopsy forceps. Resection and retrieval were  complete.                           A few localized non-bleeding erosions were found in                            the descending colon. No stigmata of recent                            bleeding were seen. Biopsies were taken with a cold                            forceps for histology.                           Scattered small and large-mouthed diverticula were                            found in the sigmoid colon, descending colon,                            transverse colon and ascending colon.                           Non-bleeding internal hemorrhoids were found during                            retroflexion. The hemorrhoids were small.                           Localized moderate inflammation characterized by                            erosions and erythema was found appendiceal                            orifice. No biopsies or other specimens were                             collected for this exam. Complications:            No immediate complications. Estimated Blood Loss:     Estimated blood loss was minimal. Impression:               - One 3 mm polyp in the transverse colon, removed                            with a cold biopsy forceps. Resected and retrieved.                           - A few erosions in the descending colon. Biopsied.                           - Diverticulosis in the sigmoid colon, in the  descending colon, in the transverse colon and in                            the ascending colon.                           - Non-bleeding internal hemorrhoids.                           - Localized moderate inflammation was found at the                            appendiceal orifice. No specimens collected. Recommendation:           - Patient has a contact number available for                            emergencies. The signs and symptoms of potential                            delayed complications were discussed with the                            patient. Return to normal activities tomorrow.                            Written discharge instructions were provided to the                            patient.                           - Resume previous diet.                           - Continue present medications.                           - Await pathology results.                           - Repeat colonoscopy in 5-10 years for surveillance                            based on pathology results.                           - Patient has a contact number available for                            emergencies. The signs and symptoms of potential                            delayed complications were discussed with the                            patient. Return  to normal activities tomorrow.                            Written discharge instructions were provided to the                            patient.                            - No ibuprofen, naproxen, or other non-steroidal                            anti-inflammatory drugs. Mauri Pole, MD 02/25/2017 1:57:31 PM This report has been signed electronically.

## 2017-02-25 NOTE — Progress Notes (Signed)
Report to PACU, RN, vss, BBS= Clear.  

## 2017-02-25 NOTE — Patient Instructions (Addendum)
  Handouts given :Polyps, Diverticulosis, and Hemorrhoids.  NO iBUPROFEN, NAPROXEN, ALEVE,ADVIL,MOTRIN ETC.   YOU HAD AN ENDOSCOPIC PROCEDURE TODAY AT [facility]:   Refer to the procedure report that was given to you for any specific questions about what was found during the examination.  If the procedure report does not answer your questions, please call your gastroenterologist to clarify.  If you requested that your care partner not be given the details of your procedure findings, then the procedure report has been included in a sealed envelope for you to review at your convenience later.  YOU SHOULD EXPECT: Some feelings of bloating in the abdomen. Passage of more gas than usual.  Walking can help get rid of the air that was put into your GI tract during the procedure and reduce the bloating. If you had a lower endoscopy (such as a colonoscopy or flexible sigmoidoscopy) you may notice spotting of blood in your stool or on the toilet paper. If you underwent a bowel prep for your procedure, you may not have a normal bowel movement for a few days.  Please Note:  You might notice some irritation and congestion in your nose or some drainage.  This is from the oxygen used during your procedure.  There is no need for concern and it should clear up in a day or so.  SYMPTOMS TO REPORT IMMEDIATELY:   Following lower endoscopy (colonoscopy or flexible sigmoidoscopy):  Excessive amounts of blood in the stool  Significant tenderness or worsening of abdominal pains  Swelling of the abdomen that is new, acute  Fever of 100F or higher   For urgent or emergent issues, a gastroenterologist can be reached at any hour by calling [phone].   DIET:  We do recommend a small meal at first, but then you may proceed to your regular diet.  Drink plenty of fluids but you should avoid alcoholic beverages for 24 hours.  ACTIVITY:  You should plan to take it easy for the rest of today and you  should NOT DRIVE or use heavy machinery until tomorrow (because of the sedation medicines used during the test).    FOLLOW UP: Our staff will call the number listed on your records the next business day following your procedure to check on you and address any questions or concerns that you may have regarding the information given to you following your procedure. If we do not reach you, we will leave a message.  However, if you are feeling well and you are not experiencing any problems, there is no need to return our call.  We will assume that you have returned to your regular daily activities without incident.  If any biopsies were taken you will be contacted by phone or by letter within the next 1-3 weeks.  Please call us at [phone] if you have not heard about the biopsies in 3 weeks.    SIGNATURES/CONFIDENTIALITY: You and/or your care partner have signed paperwork which will be entered into your electronic medical record.  These signatures attest to the fact that that the information above on your After Visit Summary has been reviewed and is understood.  Full responsibility of the confidentiality of this discharge information lies with you and/or your care-partner.

## 2017-02-25 NOTE — Progress Notes (Signed)
Called to room to assist during endoscopic procedure.  Patient ID and intended procedure confirmed with present staff. Received instructions for my participation in the procedure from the performing physician.  

## 2017-02-26 ENCOUNTER — Telehealth: Payer: Self-pay | Admitting: *Deleted

## 2017-02-26 NOTE — Telephone Encounter (Signed)
  Follow up Call-  Call back number 02/25/2017  Post procedure Call Back phone  # [phone]  Permission to leave phone message Yes  Some recent data might be hidden     Patient questions:  Do you have a fever, pain , or abdominal swelling? No. Pain Score  0 *  Have you tolerated food without any problems? Yes.    Have you been able to return to your normal activities? Yes.    Do you have any questions about your discharge instructions: Diet   No. Medications  No. Follow up visit  No.  Do you have questions or concerns about your Care? No.  Actions: * If pain score is 4 or above: No action needed, pain <4.

## 2017-04-07 ENCOUNTER — Other Ambulatory Visit: Payer: Self-pay | Admitting: Obstetrics & Gynecology

## 2017-04-07 DIAGNOSIS — Z1231 Encounter for screening mammogram for malignant neoplasm of breast: Secondary | ICD-10-CM

## 2017-04-14 ENCOUNTER — Ambulatory Visit
Admission: RE | Admit: 2017-04-14 | Discharge: 2017-04-14 | Disposition: A | Discharge status: Home / Self Care | Payer: BLUE CROSS/BLUE SHIELD | Source: Ambulatory Visit | Attending: Obstetrics & Gynecology | Admitting: Obstetrics & Gynecology

## 2017-04-14 DIAGNOSIS — Z1231 Encounter for screening mammogram for malignant neoplasm of breast: Secondary | ICD-10-CM

## 2018-01-15 ENCOUNTER — Encounter: Payer: Self-pay | Admitting: Obstetrics & Gynecology

## 2018-01-15 ENCOUNTER — Ambulatory Visit (INDEPENDENT_AMBULATORY_CARE_PROVIDER_SITE_OTHER): Payer: BLUE CROSS/BLUE SHIELD | Admitting: Obstetrics & Gynecology

## 2018-01-15 VITALS — BP 124/82 | Ht 65.0 in | Wt 164.0 lb

## 2018-01-15 DIAGNOSIS — Z79890 Hormone replacement therapy (postmenopausal): Secondary | ICD-10-CM | POA: Diagnosis not present

## 2018-01-15 DIAGNOSIS — Z90710 Acquired absence of both cervix and uterus: Secondary | ICD-10-CM | POA: Diagnosis not present

## 2018-01-15 DIAGNOSIS — Z1151 Encounter for screening for human papillomavirus (HPV): Secondary | ICD-10-CM | POA: Diagnosis not present

## 2018-01-15 DIAGNOSIS — N898 Other specified noninflammatory disorders of vagina: Secondary | ICD-10-CM | POA: Diagnosis not present

## 2018-01-15 DIAGNOSIS — Z01411 Encounter for gynecological examination (general) (routine) with abnormal findings: Secondary | ICD-10-CM

## 2018-01-15 LAB — WET PREP FOR TRICH, YEAST, CLUE

## 2018-01-15 MED ORDER — ESTRADIOL 1 MG PO TABS: 1.0000 mg | ORAL_TABLET | Freq: Every day | ORAL | 4 refills | Status: DC

## 2018-01-15 NOTE — Progress Notes (Signed)
Cassandra Vaughn 08-14-1966 [id number]   History:    52 y.o. G2P0A2 Stable boyfriend  RP:  Established patient presenting for annual gyn exam   HPI: S/P TAH/Bilateral Salpingectomy 10/2013.  H/O LEEP 1996.  Paps normal since then.  Menopause, well on Estradiol 1 mg tab daily x 12/2013.  Sisters with Breast Ca.  Patient BrCa 1-2 negative.  No pelvic pain.  No pain with IC.  C/O increased vaginal d/c with odor.  Urine/BMs wnl.  Fasting labs here today.  BMI 27.29.  Exercises regularly.  Past medical history,surgical history, family history and social history were all reviewed and documented in the EPIC chart.  Gynecologic History Patient's last menstrual period was 09/28/2013. Contraception: status post hysterectomy Last Pap: 01/2017. Results were: Negative Last mammogram: 04/2017. Results were: Negative Bone Density: Never Colonoscopy: 02/2017  Obstetric History OB History  Gravida Para Term Preterm AB Living  2 0     2 0  SAB TAB Ectopic Multiple Live Births               # Outcome Date GA Lbr Len/2nd Weight Sex Delivery Anes PTL Lv  2 AB           1 AB              ROS: A ROS was performed and pertinent positives and negatives are included in the history.  GENERAL: No fevers or chills. HEENT: No change in vision, no earache, sore throat or sinus congestion. NECK: No pain or stiffness. CARDIOVASCULAR: No chest pain or pressure. No palpitations. PULMONARY: No shortness of breath, cough or wheeze. GASTROINTESTINAL: No abdominal pain, nausea, vomiting or diarrhea, melena or bright red blood per rectum. GENITOURINARY: No urinary frequency, urgency, hesitancy or dysuria. MUSCULOSKELETAL: No joint or muscle pain, no back pain, no recent trauma. DERMATOLOGIC: No rash, no itching, no lesions. ENDOCRINE: No polyuria, polydipsia, no heat or cold intolerance. No recent change in weight. HEMATOLOGICAL: No anemia or easy bruising or bleeding. NEUROLOGIC: No headache, seizures, numbness, tingling  or weakness. PSYCHIATRIC: No depression, no loss of interest in normal activity or change in sleep pattern.     Exam:   BP 124/82 (BP Location: Right Arm, Patient Position: Sitting, Cuff Size: Normal)   Ht '5\' 5"'$  (1.651 m)   Wt 164 lb (74.4 kg)   LMP 09/28/2013   BMI 27.29 kg/m   Body mass index is 27.29 kg/m.  General appearance : Well developed well nourished female. No acute distress HEENT: Eyes: no retinal hemorrhage or exudates,  Neck supple, trachea midline, no carotid bruits, no thyroidmegaly Lungs: Clear to auscultation, no rhonchi or wheezes, or rib retractions  Heart: Regular rate and rhythm, no murmurs or gallops Breast:Examined in sitting and supine position were symmetrical in appearance, no palpable masses or tenderness,  no skin retraction, no nipple inversion, no nipple discharge, no skin discoloration, no axillary or supraclavicular lymphadenopathy Abdomen: no palpable masses or tenderness, no rebound or guarding Extremities: no edema or skin discoloration or tenderness  Pelvic: Vulva: Normal             Vagina: No gross lesions.  Increased d/c.  Wet prep done.  Pap/HR HPV done.  Cervix/Uterus absent  Adnexa  Without masses or tenderness  Anus: Normal  U/A: Completely negative Wet prep: Negative  Assessment/Plan:  52 y.o. female for annual exam   1. Encounter for gynecological examination with abnormal finding Gynecologic exam status post TAH with mild increase in vaginal secretions.  History of LEEP.  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  Breast exam normal.  Negative mammogram August 2018.  Colonoscopy done June 2018.  Will do fasting health labs here today. - CBC - Comp Met (CMET) - Lipid panel - TSH - Vitamin D 1,25 dihydroxy  2. S/P TAH (total abdominal hysterectomy)  3. Post-menopause on HRT (hormone replacement therapy) Well on estradiol 1 mg p.o. daily since April 2015.  2 sisters with breast cancer, but patient is BRCA1 and BRCA2  negative.  Counseling on risks versus benefits including risks of blood clots/stroke and risks of breast cancer.  Patient prefers to continue on estradiol therapy.  Status post total hysterectomy.  Taking vitamin D supplements, calcium rich nutrition and weightbearing physical activities regularly.  Decision not to proceed with a bone density this year will reevaluate the indication in a couple of years.  4. Vaginal odor Urine analysis and wet prep completely negative.  Patient informed and reassured. - Urinalysis,Complete w/RFL Culture - WET PREP FOR TRICH, YEAST, CLUE  Princess Bruins MD, 9:42 AM 01/15/2018

## 2018-01-15 NOTE — Patient Instructions (Signed)
1. Encounter for gynecological examination with abnormal finding Gynecologic exam status post TAH with mild increase in vaginal secretions.  History of LEEP.  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  Breast exam normal.  Negative mammogram August 2018.  Colonoscopy done June 2018.  Will do fasting health labs here today. - CBC - Comp Met (CMET) - Lipid panel - TSH - Vitamin D 1,25 dihydroxy  2. S/P TAH (total abdominal hysterectomy)  3. Post-menopause on HRT (hormone replacement therapy) Well on estradiol 1 mg p.o. daily since April 2015.  2 sisters with breast cancer, but patient is BRCA1 and BRCA2 negative.  Counseling on risks versus benefits including risks of blood clots/stroke and risks of breast cancer.  Patient prefers to continue on estradiol therapy.  Status post total hysterectomy.  Taking vitamin D supplements, calcium rich nutrition and weightbearing physical activities regularly.  Decision not to proceed with a bone density this year will reevaluate the indication in a couple of years.  4. Vaginal odor Urine analysis and wet prep completely negative.  Patient informed and reassured. - Urinalysis,Complete w/RFL Culture - WET PREP FOR [place], YEAST, CLUE  Cassandra Vaughn, it was a pleasure meeting you today!  I will inform you of your results as soon as they are available.

## 2018-01-15 NOTE — Addendum Note (Signed)
Addended by: Thurnell Garbe A on: 01/15/2018 10:56 AM   Modules accepted: Orders

## 2018-01-16 LAB — URINALYSIS, COMPLETE W/RFL CULTURE
Bacteria, UA: NONE SEEN /HPF
Bilirubin Urine: NEGATIVE
GLUCOSE, UA: NEGATIVE
HGB URINE DIPSTICK: NEGATIVE
HYALINE CAST: NONE SEEN /LPF
Ketones, ur: NEGATIVE
Leukocyte Esterase: NEGATIVE
NITRITES URINE, INITIAL: NEGATIVE
PH: 5.5 (ref 5.0–8.0)
PROTEIN: NEGATIVE
RBC / HPF: NONE SEEN /HPF (ref 0–2)
Specific Gravity, Urine: 1.015 (ref 1.001–1.03)
WBC, UA: NONE SEEN /HPF (ref 0–5)

## 2018-01-16 LAB — NO CULTURE INDICATED

## 2018-01-17 LAB — COMPREHENSIVE METABOLIC PANEL
AG RATIO: 1.3 (calc) (ref 1.0–2.5)
ALT: 17 U/L (ref 6–29)
AST: 21 U/L (ref 10–35)
Albumin: 4.3 g/dL (ref 3.6–5.1)
Alkaline phosphatase (APISO): 89 U/L (ref 33–130)
BUN: 16 mg/dL (ref 7–25)
CHLORIDE: 103 mmol/L (ref 98–110)
CO2: 27 mmol/L (ref 20–32)
CREATININE: 0.92 mg/dL (ref 0.50–1.05)
Calcium: 10 mg/dL (ref 8.6–10.4)
GLOBULIN: 3.2 g/dL (ref 1.9–3.7)
GLUCOSE: 73 mg/dL (ref 65–99)
Potassium: 4.4 mmol/L (ref 3.5–5.3)
Sodium: 138 mmol/L (ref 135–146)
Total Bilirubin: 0.8 mg/dL (ref 0.2–1.2)
Total Protein: 7.5 g/dL (ref 6.1–8.1)

## 2018-01-17 LAB — PAP, TP IMAGING W/ HPV RNA, RFLX HPV TYPE 16,18/45: HPV DNA High Risk: NOT DETECTED

## 2018-01-17 LAB — LIPID PANEL
CHOL/HDL RATIO: 2.4 (calc) (ref <5.0)
CHOLESTEROL: 238 mg/dL — AB (ref <200)
HDL: 98 mg/dL (ref >50)
LDL Cholesterol (Calc): 125 mg/dL (calc) — ABNORMAL HIGH
Non-HDL Cholesterol (Calc): 140 mg/dL (calc) — ABNORMAL HIGH (ref <130)
Triglycerides: 55 mg/dL (ref <150)

## 2018-01-17 LAB — CBC
HCT: 44.9 % (ref 35.0–45.0)
HEMOGLOBIN: 15.3 g/dL (ref 11.7–15.5)
MCH: 31 pg (ref 27.0–33.0)
MCHC: 34.1 g/dL (ref 32.0–36.0)
MCV: 91.1 fL (ref 80.0–100.0)
MPV: 12.2 fL (ref 7.5–12.5)
PLATELETS: 195 10*3/uL (ref 140–400)
RBC: 4.93 10*6/uL (ref 3.80–5.10)
RDW: 12.8 % (ref 11.0–15.0)
WBC: 3.6 10*3/uL — ABNORMAL LOW (ref 3.8–10.8)

## 2018-01-17 LAB — VITAMIN D 1,25 DIHYDROXY
VITAMIN D 1, 25 (OH) TOTAL: 64 pg/mL (ref 18–72)
Vitamin D2 1, 25 (OH)2: <8 pg/mL
Vitamin D3 1, 25 (OH)2: 64 pg/mL

## 2018-01-17 LAB — TSH: TSH: 1.01 mIU/L

## 2018-02-10 ENCOUNTER — Telehealth: Payer: Self-pay

## 2018-02-10 MED ORDER — METRONIDAZOLE 500 MG PO TABS: 500.0000 mg | ORAL_TABLET | Freq: Two times a day (BID) | ORAL | 0 refills | Status: DC

## 2018-02-10 NOTE — Telephone Encounter (Signed)
Agree with Metonidazole 500 mg BID x 5 days.

## 2018-02-10 NOTE — Telephone Encounter (Signed)
Patient saw Dr. Marguerita Merles on 01/15/18 for CE and mentioned then increased vaginal discharge with odor. Patient called today because symptoms persist and she would like to try something to get rid of the odor. She said Dr. Toney Rakes has given her Metronidazole in the past for this and she would like to try that.

## 2018-02-10 NOTE — Telephone Encounter (Signed)
Per DPR access note on file I left detailed voice mail letting her know that Dr. Carlean Jews was fine with covering her with the Metronidazole and Rx has been sent to her pharmacy.

## 2018-03-09 ENCOUNTER — Other Ambulatory Visit: Payer: Self-pay | Admitting: *Deleted

## 2018-03-09 DIAGNOSIS — M25539 Pain in unspecified wrist: Secondary | ICD-10-CM

## 2018-03-10 ENCOUNTER — Ambulatory Visit (INDEPENDENT_AMBULATORY_CARE_PROVIDER_SITE_OTHER): Payer: BLUE CROSS/BLUE SHIELD | Admitting: Neurology

## 2018-03-10 DIAGNOSIS — G5603 Carpal tunnel syndrome, bilateral upper limbs: Secondary | ICD-10-CM

## 2018-03-10 DIAGNOSIS — M25539 Pain in unspecified wrist: Secondary | ICD-10-CM | POA: Diagnosis not present

## 2018-03-10 NOTE — Procedures (Signed)
[facility]  [address], [address]  [place], [place] [address] Tel: [phone] Fax:  [phone] Test Date:  03/10/2018  Patient: Cassandra Vaughn DOB: December 07, 1965 Physician: Narda Amber, DO  Sex: Female Height: 5\' 5"  Ref Phys: Raynaldo Opitz  ID#: [id number] Temp: 34.8C Technician:    Patient Complaints: This is a 52 year-old female referred for evaluation of right hand numbness and tingling.  NCV & EMG Findings: Extensive electrodiagnostic testing of the right upper extremity and additional studies of the left shows:  1. Right mixed palmar sensory responses show prolonged latency. The left median sensory response shows prolonged latency (3.7 ms).  Right median and bilateral ulnar sensory responses are within normal limits. 2. Bilateral median and ulnar motor responses are within normal limits. 3. There is no evidence of active or chronic motor axon loss changes affecting any of the tested muscles. Motor unit configuration and recruitment pattern is within normal limits.  Impression: Bilateral median neuropathy at or distal to the wrist, consistent with the clinical diagnosis of carpal tunnel syndrome. Overall, these findings are very mild in degree electrically.   ___________________________ Narda Amber, DO    Nerve Conduction Studies Anti Sensory Summary Table   Stim Site NR Peak (ms) Norm Peak (ms) P-T Amp (V) Norm P-T Amp  Left Median Anti Sensory (2nd Digit)  34.8C  Wrist    3.7 <3.6 26.1 >15  Right Median Anti Sensory (2nd Digit)  34.8C  Wrist    2.9 <3.6 18.7 >15  Left Ulnar Anti Sensory (5th Digit)  34.8C  Wrist    2.8 <3.1 30.4 >10  Right Ulnar Anti Sensory (5th Digit)  34.8C  Wrist    2.5 <3.1 33.6 >10   Motor Summary Table   Stim Site NR Onset (ms) Norm Onset (ms) O-P Amp (mV) Norm O-P Amp Site1 Site2 Delta-0 (ms) Dist (cm) Vel (m/s) Norm Vel (m/s)  Left Median Motor (Abd Poll Brev)  34.8C  Wrist    3.4 <4.0 12.3 >6 Elbow Wrist 5.0 29.0  58 >50  Elbow    8.4  12.1         Right Median Motor (Abd Poll Brev)  34.8C  Wrist    2.9 <4.0 14.3 >6 Elbow Wrist 4.6 30.0 65 >50  Elbow    7.5  13.8         Left Ulnar Motor (Abd Dig Minimi)  34.8C  Wrist    1.8 <3.1 11.9 >7 B Elbow Wrist 3.9 26.0 67 >50  B Elbow    5.7  11.7  A Elbow B Elbow 1.6 10.0 63 >50  A Elbow    7.3  11.7         Right Ulnar Motor (Abd Dig Minimi)  34.8C  Wrist    2.0 <3.1 10.9 >7 B Elbow Wrist 3.7 24.0 65 >50  B Elbow    5.7  10.4  A Elbow B Elbow 1.6 10.0 63 >50  A Elbow    7.3  10.4          Comparison Summary Table   Stim Site NR Peak (ms) Norm Peak (ms) P-T Amp (V) Site1 Site2 Delta-P (ms) Norm Delta (ms)  Right Median/Ulnar Palm Comparison (Wrist - 8cm)  34.8C  Median Palm    2.0 <2.2 23.0 Median Palm Ulnar Palm 0.5   Ulnar Palm    1.5 <2.2 17.8       EMG   Side Muscle Ins Act Fibs Psw Fasc Number  Recrt Dur Dur. Amp Amp. Poly Poly. Comment  Right 1stDorInt Nml Nml Nml Nml Nml Nml Nml Nml Nml Nml Nml Nml N/A  Right Abd Poll Brev Nml Nml Nml Nml Nml Nml Nml Nml Nml Nml Nml Nml N/A  Right PronatorTeres Nml Nml Nml Nml Nml Nml Nml Nml Nml Nml Nml Nml N/A  Right Biceps Nml Nml Nml Nml Nml Nml Nml Nml Nml Nml Nml Nml N/A  Right Triceps Nml Nml Nml Nml Nml Nml Nml Nml Nml Nml Nml Nml N/A  Right Deltoid Nml Nml Nml Nml Nml Nml Nml Nml Nml Nml Nml Nml N/A      Waveforms:

## 2018-04-22 ENCOUNTER — Other Ambulatory Visit: Payer: Self-pay | Admitting: Obstetrics & Gynecology

## 2018-04-22 DIAGNOSIS — Z1231 Encounter for screening mammogram for malignant neoplasm of breast: Secondary | ICD-10-CM

## 2018-05-18 ENCOUNTER — Ambulatory Visit: Payer: BLUE CROSS/BLUE SHIELD

## 2018-05-20 ENCOUNTER — Ambulatory Visit
Admission: RE | Admit: 2018-05-20 | Discharge: 2018-05-20 | Disposition: A | Discharge status: Home / Self Care | Payer: BLUE CROSS/BLUE SHIELD | Source: Ambulatory Visit | Attending: Obstetrics & Gynecology | Admitting: Obstetrics & Gynecology

## 2018-05-20 DIAGNOSIS — Z1231 Encounter for screening mammogram for malignant neoplasm of breast: Secondary | ICD-10-CM

## 2019-01-11 DIAGNOSIS — J0110 Acute frontal sinusitis, unspecified: Secondary | ICD-10-CM | POA: Diagnosis not present

## 2019-01-11 DIAGNOSIS — J301 Allergic rhinitis due to pollen: Secondary | ICD-10-CM | POA: Diagnosis not present

## 2019-01-20 ENCOUNTER — Other Ambulatory Visit: Payer: Self-pay

## 2019-01-21 ENCOUNTER — Ambulatory Visit (INDEPENDENT_AMBULATORY_CARE_PROVIDER_SITE_OTHER): Payer: BLUE CROSS/BLUE SHIELD | Admitting: Obstetrics & Gynecology

## 2019-01-21 ENCOUNTER — Encounter: Payer: Self-pay | Admitting: Obstetrics & Gynecology

## 2019-01-21 ENCOUNTER — Other Ambulatory Visit: Payer: Self-pay

## 2019-01-21 VITALS — BP 120/70 | Ht 65.0 in | Wt 164.0 lb

## 2019-01-21 DIAGNOSIS — Z79890 Hormone replacement therapy (postmenopausal): Secondary | ICD-10-CM | POA: Diagnosis not present

## 2019-01-21 DIAGNOSIS — N898 Other specified noninflammatory disorders of vagina: Secondary | ICD-10-CM | POA: Diagnosis not present

## 2019-01-21 DIAGNOSIS — Z01419 Encounter for gynecological examination (general) (routine) without abnormal findings: Secondary | ICD-10-CM | POA: Diagnosis not present

## 2019-01-21 DIAGNOSIS — Z90710 Acquired absence of both cervix and uterus: Secondary | ICD-10-CM

## 2019-01-21 LAB — WET PREP FOR TRICH, YEAST, CLUE

## 2019-01-21 MED ORDER — ESTRADIOL 0.5 MG PO TABS: 0.5000 mg | ORAL_TABLET | Freq: Every day | ORAL | 4 refills | Status: DC

## 2019-01-21 NOTE — Progress Notes (Signed)
Cassandra Vaughn Sep 02, 1966 [id number]   History:    53 y.o. G24P0A2 Married  RP:  Established patient presenting for annual gyn exam   HPI: S/P TAH/Bilateral Salpingectomy.  H/O LEEP in 1996, pap tests normal since then.  Menopause, well on Estradiol 1 mg PO daily x 2015.  No hot flushes or night sweats. Sisters with Breast Ca.  Patient is BrCa1-2 negative.  No pelvic pain.  No pain with IC.  Mild increase in vaginal discharge.  Frequent BV infections.  BMI 27.29.  Good fitness.  Fasting Health Labs here today.  Past medical history,surgical history, family history and social history were all reviewed and documented in the EPIC chart.  Gynecologic History Patient's last menstrual period was 09/28/2013. Contraception: status post hysterectomy Last Pap: 01/2018. Results were: Negative, HPV HR neg Last mammogram: 05/2018. Results were: Negative Bone Density: Never Colonoscopy: 02/2017  Obstetric History OB History  Gravida Para Term Preterm AB Living  2 0     2 0  SAB TAB Ectopic Multiple Live Births               # Outcome Date GA Lbr Len/2nd Weight Sex Delivery Anes PTL Lv  2 AB           1 AB              ROS: A ROS was performed and pertinent positives and negatives are included in the history.  GENERAL: No fevers or chills. HEENT: No change in vision, no earache, sore throat or sinus congestion. NECK: No pain or stiffness. CARDIOVASCULAR: No chest pain or pressure. No palpitations. PULMONARY: No shortness of breath, cough or wheeze. GASTROINTESTINAL: No abdominal pain, nausea, vomiting or diarrhea, melena or bright red blood per rectum. GENITOURINARY: No urinary frequency, urgency, hesitancy or dysuria. MUSCULOSKELETAL: No joint or muscle pain, no back pain, no recent trauma. DERMATOLOGIC: No rash, no itching, no lesions. ENDOCRINE: No polyuria, polydipsia, no heat or cold intolerance. No recent change in weight. HEMATOLOGICAL: No anemia or easy bruising or bleeding. NEUROLOGIC:  No headache, seizures, numbness, tingling or weakness. PSYCHIATRIC: No depression, no loss of interest in normal activity or change in sleep pattern.     Exam:   BP 120/70   Ht _0  (1.651 m)   Wt 164 lb (74.4 kg)   LMP 09/28/2013   BMI 27.29 kg/m   Body mass index is 27.29 kg/m.  General appearance : Well developed well nourished female. No acute distress HEENT: Eyes: no retinal hemorrhage or exudates,  Neck supple, trachea midline, no carotid bruits, no thyroidmegaly Lungs: Clear to auscultation, no rhonchi or wheezes, or rib retractions  Heart: Regular rate and rhythm, no murmurs or gallops Breast:Examined in sitting and supine position were symmetrical in appearance, no palpable masses or tenderness,  no skin retraction, no nipple inversion, no nipple discharge, no skin discoloration, no axillary or supraclavicular lymphadenopathy Abdomen: no palpable masses or tenderness, no rebound or guarding Extremities: no edema or skin discoloration or tenderness  Pelvic: Vulva: Normal             Vagina: No gross lesions.  Mild vaginal d/c.  Wet prep done.  Cervix/Uterus absent  Adnexa  Without masses or tenderness  Anus: Normal  Wet prep negative   Assessment/Plan:  53 y.o. female for annual exam   1. Well female exam with routine gynecological exam Gynecologic exam status post TAH in menopause.  History of LEEP in 1996.  Pap tests normal  since then.  Pap test May 2019 was negative with negative high-risk HPV.  No indication to repeat the Pap test this year.  Breast exam normal.  Screening mammogram September 2019 was negative.  Sisters with breast cancer.  Patient is BRCA 1 and 2-.  Health labs here today.  Colonoscopy in June 2018.  Body mass index 27.29.  Patient is fit and has a healthy nutrition. - CBC - Comp Met (CMET) - TSH - VITAMIN D 25 Hydroxy (Vit-D Deficiency, Fractures) - Lipid panel  2. S/P TAH (total abdominal hysterectomy)  3. Postmenopausal hormone  replacement therapy Well on hormone replacement therapy with estradiol 1 mg daily since 2015.  Status post TAH.  Decision to decrease the dosage of estradiol to 0.5 mg daily.  Usage reviewed and prescription sent to pharmacy.  4. Vaginal discharge Wet prep negative.  Patient reassured. - WET PREP FOR [place], YEAST, CLUE  Other orders - estradiol (ESTRACE) 0.5 MG tablet; Take 1 tablet (0.5 mg total) by mouth daily.  Princess Bruins MD, 9:22 AM 01/21/2019

## 2019-01-21 NOTE — Patient Instructions (Signed)
1. Well female exam with routine gynecological exam Gynecologic exam status post TAH in menopause.  History of LEEP in 1996.  Pap tests normal since then.  Pap test May 2019 was negative with negative high-risk HPV.  No indication to repeat the Pap test this year.  Breast exam normal.  Screening mammogram September 2019 was negative.  Sisters with breast cancer.  Patient is BRCA 1 and 2-.  Health labs here today.  Colonoscopy in June 2018.  Body mass index 27.29.  Patient is fit and has a healthy nutrition. - CBC - Comp Met (CMET) - TSH - VITAMIN D 25 Hydroxy (Vit-D Deficiency, Fractures) - Lipid panel  2. S/P TAH (total abdominal hysterectomy)  3. Postmenopausal hormone replacement therapy Well on hormone replacement therapy with estradiol 1 mg daily since 2015.  Status post TAH.  Decision to decrease the dosage of estradiol to 0.5 mg daily.  Usage reviewed and prescription sent to pharmacy.  4. Vaginal discharge Wet prep negative.  Patient reassured. - WET PREP FOR [place], YEAST, CLUE  Other orders - estradiol (ESTRACE) 0.5 MG tablet; Take 1 tablet (0.5 mg total) by mouth daily.  Lurline, it was a pleasure seeing you today!  I will inform you of your results as soon as they are available.

## 2019-01-22 LAB — CBC
HCT: 45.1 % — ABNORMAL HIGH (ref 35.0–45.0)
Hemoglobin: 15 g/dL (ref 11.7–15.5)
MCH: 31 pg (ref 27.0–33.0)
MCHC: 33.3 g/dL (ref 32.0–36.0)
MCV: 93.2 fL (ref 80.0–100.0)
MPV: 12.6 fL — ABNORMAL HIGH (ref 7.5–12.5)
Platelets: 202 10*3/uL (ref 140–400)
RBC: 4.84 10*6/uL (ref 3.80–5.10)
RDW: 13.1 % (ref 11.0–15.0)
WBC: 4.1 10*3/uL (ref 3.8–10.8)

## 2019-01-22 LAB — COMPREHENSIVE METABOLIC PANEL
AG Ratio: 1.3 (calc) (ref 1.0–2.5)
ALT: 17 U/L (ref 6–29)
AST: 20 U/L (ref 10–35)
Albumin: 4 g/dL (ref 3.6–5.1)
Alkaline phosphatase (APISO): 77 U/L (ref 37–153)
BUN: 18 mg/dL (ref 7–25)
CO2: 30 mmol/L (ref 20–32)
Calcium: 9.7 mg/dL (ref 8.6–10.4)
Chloride: 105 mmol/L (ref 98–110)
Creat: 0.9 mg/dL (ref 0.50–1.05)
Globulin: 3.2 g/dL (calc) (ref 1.9–3.7)
Glucose, Bld: 77 mg/dL (ref 65–99)
Potassium: 4.2 mmol/L (ref 3.5–5.3)
Sodium: 140 mmol/L (ref 135–146)
Total Bilirubin: 0.7 mg/dL (ref 0.2–1.2)
Total Protein: 7.2 g/dL (ref 6.1–8.1)

## 2019-01-22 LAB — LIPID PANEL
Cholesterol: 228 mg/dL — ABNORMAL HIGH (ref <200)
HDL: 90 mg/dL (ref >=50)
LDL Cholesterol (Calc): 123 mg/dL (calc) — ABNORMAL HIGH
Non-HDL Cholesterol (Calc): 138 mg/dL (calc) — ABNORMAL HIGH (ref <130)
Total CHOL/HDL Ratio: 2.5 (calc) (ref <5.0)
Triglycerides: 63 mg/dL (ref <150)

## 2019-01-22 LAB — VITAMIN D 25 HYDROXY (VIT D DEFICIENCY, FRACTURES): Vit D, 25-Hydroxy: 32 ng/mL (ref 30–100)

## 2019-01-22 LAB — TSH: TSH: 1.26 mIU/L

## 2019-03-12 ENCOUNTER — Other Ambulatory Visit: Payer: Self-pay | Admitting: Obstetrics & Gynecology

## 2019-03-19 ENCOUNTER — Other Ambulatory Visit: Payer: Self-pay

## 2019-06-02 ENCOUNTER — Other Ambulatory Visit: Payer: Self-pay | Admitting: Obstetrics & Gynecology

## 2019-06-02 DIAGNOSIS — Z1231 Encounter for screening mammogram for malignant neoplasm of breast: Secondary | ICD-10-CM

## 2019-06-27 DIAGNOSIS — J0140 Acute pansinusitis, unspecified: Secondary | ICD-10-CM | POA: Diagnosis not present

## 2019-07-14 ENCOUNTER — Ambulatory Visit
Admission: RE | Admit: 2019-07-14 | Discharge: 2019-07-14 | Disposition: A | Discharge status: Home / Self Care | Payer: BLUE CROSS/BLUE SHIELD | Source: Ambulatory Visit | Attending: Obstetrics & Gynecology | Admitting: Obstetrics & Gynecology

## 2019-07-14 ENCOUNTER — Other Ambulatory Visit: Payer: Self-pay

## 2019-07-14 DIAGNOSIS — Z1231 Encounter for screening mammogram for malignant neoplasm of breast: Secondary | ICD-10-CM

## 2019-07-19 ENCOUNTER — Other Ambulatory Visit: Payer: Self-pay | Admitting: Obstetrics & Gynecology

## 2019-07-19 DIAGNOSIS — R928 Other abnormal and inconclusive findings on diagnostic imaging of breast: Secondary | ICD-10-CM

## 2019-07-19 DIAGNOSIS — Z20828 Contact with and (suspected) exposure to other viral communicable diseases: Secondary | ICD-10-CM | POA: Diagnosis not present

## 2019-07-23 ENCOUNTER — Other Ambulatory Visit: Payer: BC Managed Care – PPO

## 2019-07-27 ENCOUNTER — Other Ambulatory Visit: Payer: Self-pay

## 2019-07-27 ENCOUNTER — Ambulatory Visit
Admission: RE | Admit: 2019-07-27 | Discharge: 2019-07-27 | Disposition: A | Discharge status: Home / Self Care | Payer: BC Managed Care – PPO | Source: Ambulatory Visit | Attending: Obstetrics & Gynecology | Admitting: Obstetrics & Gynecology

## 2019-07-27 DIAGNOSIS — R928 Other abnormal and inconclusive findings on diagnostic imaging of breast: Secondary | ICD-10-CM

## 2019-07-27 DIAGNOSIS — N6321 Unspecified lump in the left breast, upper outer quadrant: Secondary | ICD-10-CM | POA: Diagnosis not present

## 2019-07-27 DIAGNOSIS — R922 Inconclusive mammogram: Secondary | ICD-10-CM | POA: Diagnosis not present

## 2019-10-25 DIAGNOSIS — J018 Other acute sinusitis: Secondary | ICD-10-CM | POA: Diagnosis not present

## 2020-01-24 ENCOUNTER — Encounter: Payer: BLUE CROSS/BLUE SHIELD | Admitting: Obstetrics & Gynecology

## 2020-01-25 ENCOUNTER — Other Ambulatory Visit: Payer: Self-pay | Admitting: Obstetrics & Gynecology

## 2020-01-26 ENCOUNTER — Other Ambulatory Visit: Payer: Self-pay

## 2020-01-27 ENCOUNTER — Encounter: Payer: Self-pay | Admitting: Obstetrics & Gynecology

## 2020-01-27 ENCOUNTER — Ambulatory Visit (INDEPENDENT_AMBULATORY_CARE_PROVIDER_SITE_OTHER): Payer: BC Managed Care – PPO | Admitting: Obstetrics & Gynecology

## 2020-01-27 VITALS — BP 110/70 | Ht 64.5 in | Wt 167.4 lb

## 2020-01-27 DIAGNOSIS — Z79890 Hormone replacement therapy (postmenopausal): Secondary | ICD-10-CM

## 2020-01-27 DIAGNOSIS — E785 Hyperlipidemia, unspecified: Secondary | ICD-10-CM | POA: Diagnosis not present

## 2020-01-27 DIAGNOSIS — Z01419 Encounter for gynecological examination (general) (routine) without abnormal findings: Secondary | ICD-10-CM

## 2020-01-27 DIAGNOSIS — Z90710 Acquired absence of both cervix and uterus: Secondary | ICD-10-CM

## 2020-01-27 DIAGNOSIS — Z1321 Encounter for screening for nutritional disorder: Secondary | ICD-10-CM | POA: Diagnosis not present

## 2020-01-27 DIAGNOSIS — Z1329 Encounter for screening for other suspected endocrine disorder: Secondary | ICD-10-CM | POA: Diagnosis not present

## 2020-01-27 MED ORDER — ESTRADIOL 0.5 MG PO TABS: 0.5000 mg | ORAL_TABLET | Freq: Every day | ORAL | 4 refills | Status: DC

## 2020-01-27 NOTE — Progress Notes (Signed)
Cassandra Vaughn 02/10/1966 [id number]   History:    54 y.o.  G68P0A2 Married  RP:  Established patient presenting for annual gyn exam   HPI: S/P TAH/Bilateral Salpingectomy.  H/O LEEP in 1996, pap tests normal since then.  Menopause, well on Estradiol 1 mg PO daily x 2015.  No hot flushes or night sweats. Sisters with Breast Ca.  Patient is BrCa1-2 negative.  No pelvic pain.  No pain with IC.  Normal vaginal secretions.  BMI 28.29.  Good fitness.  Fasting Health Labs here today.   Past medical history,surgical history, family history and social history were all reviewed and documented in the EPIC chart.  Gynecologic History Patient's last menstrual period was 09/28/2013.  Obstetric History OB History  Gravida Para Term Preterm AB Living  2 0     2 0  SAB TAB Ectopic Multiple Live Births               # Outcome Date GA Lbr Len/2nd Weight Sex Delivery Anes PTL Lv  2 AB           1 AB              ROS: A ROS was performed and pertinent positives and negatives are included in the history.  GENERAL: No fevers or chills. HEENT: No change in vision, no earache, sore throat or sinus congestion. NECK: No pain or stiffness. CARDIOVASCULAR: No chest pain or pressure. No palpitations. PULMONARY: No shortness of breath, cough or wheeze. GASTROINTESTINAL: No abdominal pain, nausea, vomiting or diarrhea, melena or bright red blood per rectum. GENITOURINARY: No urinary frequency, urgency, hesitancy or dysuria. MUSCULOSKELETAL: No joint or muscle pain, no back pain, no recent trauma. DERMATOLOGIC: No rash, no itching, no lesions. ENDOCRINE: No polyuria, polydipsia, no heat or cold intolerance. No recent change in weight. HEMATOLOGICAL: No anemia or easy bruising or bleeding. NEUROLOGIC: No headache, seizures, numbness, tingling or weakness. PSYCHIATRIC: No depression, no loss of interest in normal activity or change in sleep pattern.     Exam:   BP 110/70   Ht 5' 4.5" (1.638 m)   Wt  167 lb 6.4 oz (75.9 kg)   LMP 09/28/2013   BMI 28.29 kg/m   Body mass index is 28.29 kg/m.  General appearance : Well developed well nourished female. No acute distress HEENT: Eyes: no retinal hemorrhage or exudates,  Neck supple, trachea midline, no carotid bruits, no thyroidmegaly Lungs: Clear to auscultation, no rhonchi or wheezes, or rib retractions  Heart: Regular rate and rhythm, no murmurs or gallops Breast:Examined in sitting and supine position were symmetrical in appearance, no palpable masses or tenderness,  no skin retraction, no nipple inversion, no nipple discharge, no skin discoloration, no axillary or supraclavicular lymphadenopathy Abdomen: no palpable masses or tenderness, no rebound or guarding Extremities: no edema or skin discoloration or tenderness  Pelvic: Vulva: Normal             Vagina: No gross lesions or discharge  Cervix/Uterus absent  Adnexa  Without masses or tenderness  Anus: Normal   Assessment/Plan:  54 y.o. female for annual exam   1. Well female exam with routine gynecological exam Gynecologic exam status post total abdominal hysterectomy and menopause.  History of LEEP.  Pap test May 2019 was negative.  We will repeat a Pap test next year.  Breast exam normal.  Screening mammogram November 2020 was benign.  Colonoscopy at age 13 was normal.  Will do fasting health labs  here today.  Body mass index 28.29.  Recommend a lower calorie/carb diet such as [employer].  Aerobic activities 5 times a week and light weightlifting every 2 days. - CBC - Comp Met (CMET) - TSH - Lipid panel - VITAMIN D 25 Hydroxy (Vit-D Deficiency, Fractures)  2. S/P TAH (total abdominal hysterectomy)  3. Postmenopausal hormone replacement therapy Well on estradiol 0.5 mg 1 tablet per mouth daily.  No contraindication to continue.  Prescription sent to pharmacy.  Other orders - estradiol (ESTRACE) 0.5 MG tablet; Take 1 tablet (0.5 mg total) by mouth  daily.  Princess Bruins MD, 8:17 AM 01/27/2020

## 2020-01-27 NOTE — Patient Instructions (Signed)
1. Well female exam with routine gynecological exam Gynecologic exam status post total abdominal hysterectomy and menopause.  History of LEEP.  Pap test May 2019 was negative.  We will repeat a Pap test next year.  Breast exam normal.  Screening mammogram November 2020 was benign.  Colonoscopy at age 54 was normal.  Will do fasting health labs here today.  Body mass index 28.29.  Recommend a lower calorie/carb diet such as [employer].  Aerobic activities 5 times a week and light weightlifting every 2 days. - CBC - Comp Met (CMET) - TSH - Lipid panel - VITAMIN D 25 Hydroxy (Vit-D Deficiency, Fractures)  2. S/P TAH (total abdominal hysterectomy)  3. Postmenopausal hormone replacement therapy Well on estradiol 0.5 mg 1 tablet per mouth daily.  No contraindication to continue.  Prescription sent to pharmacy.  Other orders - estradiol (ESTRACE) 0.5 MG tablet; Take 1 tablet (0.5 mg total) by mouth daily.  Cassandra Vaughn, it was a pleasure seeing you today!  I will inform you of your results as soon as they are available.

## 2020-01-28 ENCOUNTER — Encounter: Payer: Self-pay | Admitting: *Deleted

## 2020-01-28 LAB — TSH: TSH: 1.19 mIU/L

## 2020-01-28 LAB — COMPREHENSIVE METABOLIC PANEL
AG Ratio: 1.2 (calc) (ref 1.0–2.5)
ALT: 17 U/L (ref 6–29)
AST: 20 U/L (ref 10–35)
Albumin: 4.2 g/dL (ref 3.6–5.1)
Alkaline phosphatase (APISO): 83 U/L (ref 37–153)
BUN: 18 mg/dL (ref 7–25)
CO2: 28 mmol/L (ref 20–32)
Calcium: 10 mg/dL (ref 8.6–10.4)
Chloride: 102 mmol/L (ref 98–110)
Creat: 1.05 mg/dL (ref 0.50–1.05)
Globulin: 3.4 g/dL (calc) (ref 1.9–3.7)
Glucose, Bld: 79 mg/dL (ref 65–99)
Potassium: 4.2 mmol/L (ref 3.5–5.3)
Sodium: 138 mmol/L (ref 135–146)
Total Bilirubin: 0.7 mg/dL (ref 0.2–1.2)
Total Protein: 7.6 g/dL (ref 6.1–8.1)

## 2020-01-28 LAB — CBC
HCT: 46.5 % — ABNORMAL HIGH (ref 35.0–45.0)
Hemoglobin: 15.3 g/dL (ref 11.7–15.5)
MCH: 31.1 pg (ref 27.0–33.0)
MCHC: 32.9 g/dL (ref 32.0–36.0)
MCV: 94.5 fL (ref 80.0–100.0)
MPV: 12.1 fL (ref 7.5–12.5)
Platelets: 217 10*3/uL (ref 140–400)
RBC: 4.92 10*6/uL (ref 3.80–5.10)
RDW: 13.1 % (ref 11.0–15.0)
WBC: 4.2 10*3/uL (ref 3.8–10.8)

## 2020-01-28 LAB — LIPID PANEL
Cholesterol: 289 mg/dL — ABNORMAL HIGH (ref <200)
HDL: 104 mg/dL (ref >=50)
LDL Cholesterol (Calc): 168 mg/dL (calc) — ABNORMAL HIGH
Non-HDL Cholesterol (Calc): 185 mg/dL (calc) — ABNORMAL HIGH (ref <130)
Total CHOL/HDL Ratio: 2.8 (calc) (ref <5.0)
Triglycerides: 71 mg/dL (ref <150)

## 2020-01-28 LAB — VITAMIN D 25 HYDROXY (VIT D DEFICIENCY, FRACTURES): Vit D, 25-Hydroxy: 29 ng/mL — ABNORMAL LOW (ref 30–100)

## 2020-04-08 IMAGING — US US BREAST*L* LIMITED INC AXILLA
1 series · 7 of 7 positions shown · non-contrast
Comparison: Previous exam(s).

CLINICAL DATA: Possible mass in the outer left breast on a recent
screening mammogram. The patient reports some weight gain since her
last mammogram.

EXAM:
DIGITAL DIAGNOSTIC LEFT MAMMOGRAM WITH CAD AND TOMO
ULTRASOUND LEFT BREAST

[Series 1: us breast*left* limited inc axilla · 0.05mm/px · 7 of 7 slices shown]
[im 1/7]
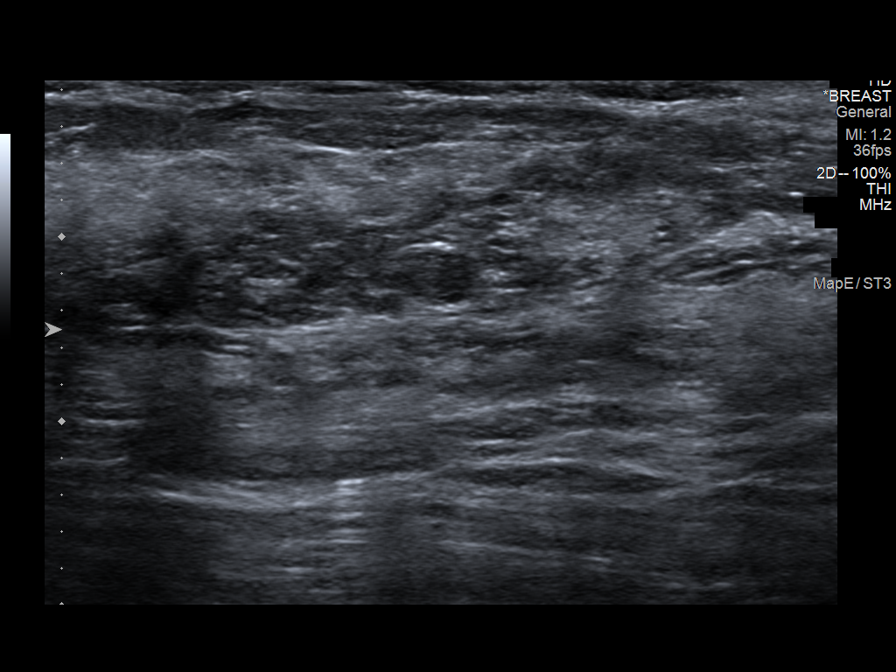
[im 2/7]
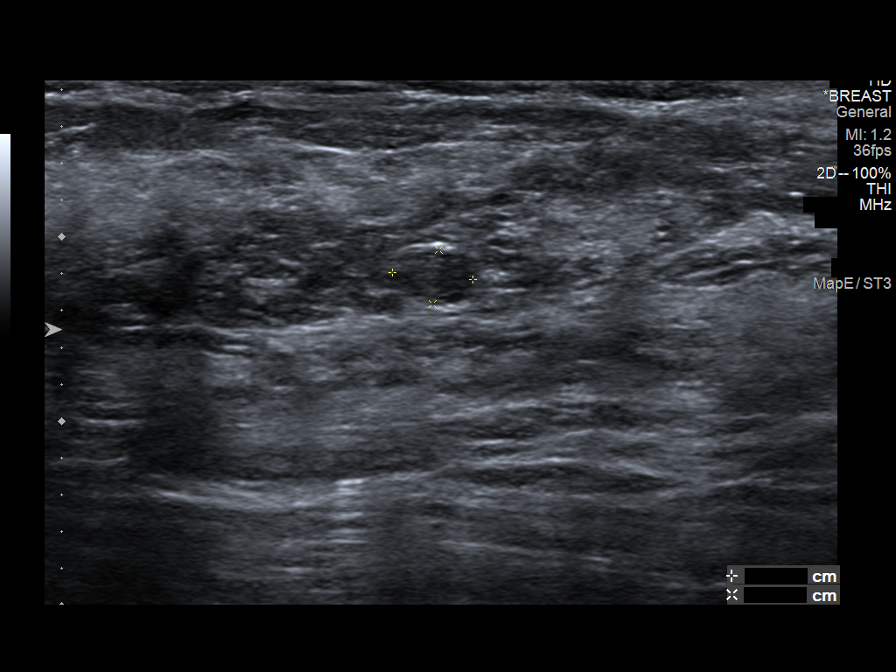
[im 3/7]
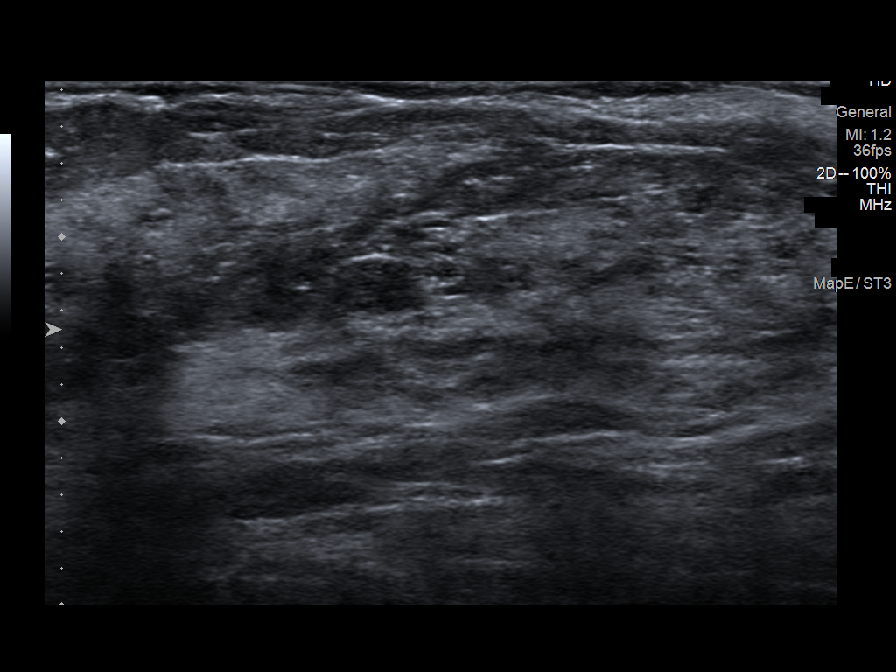
[im 4/7]
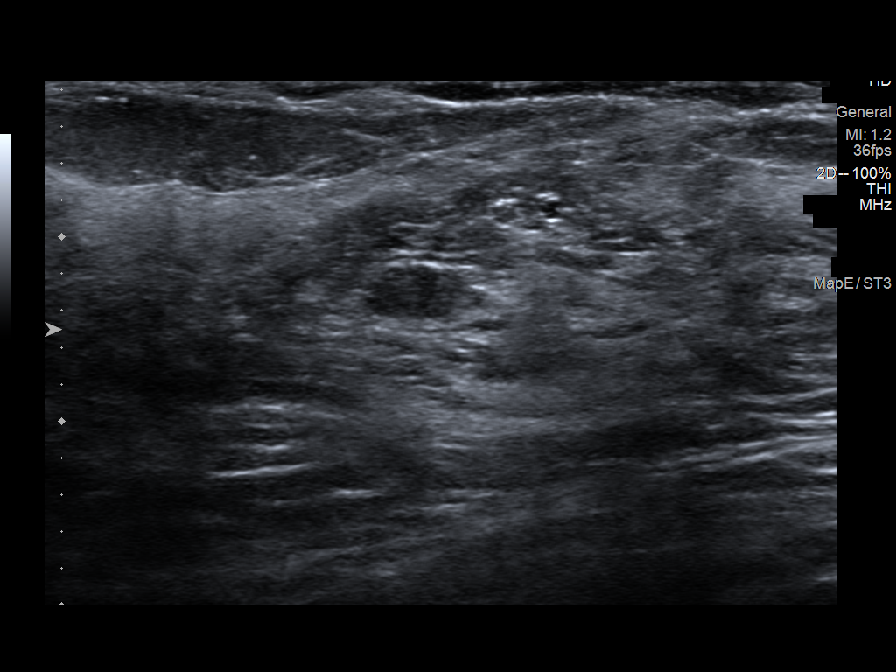
[im 5/7]
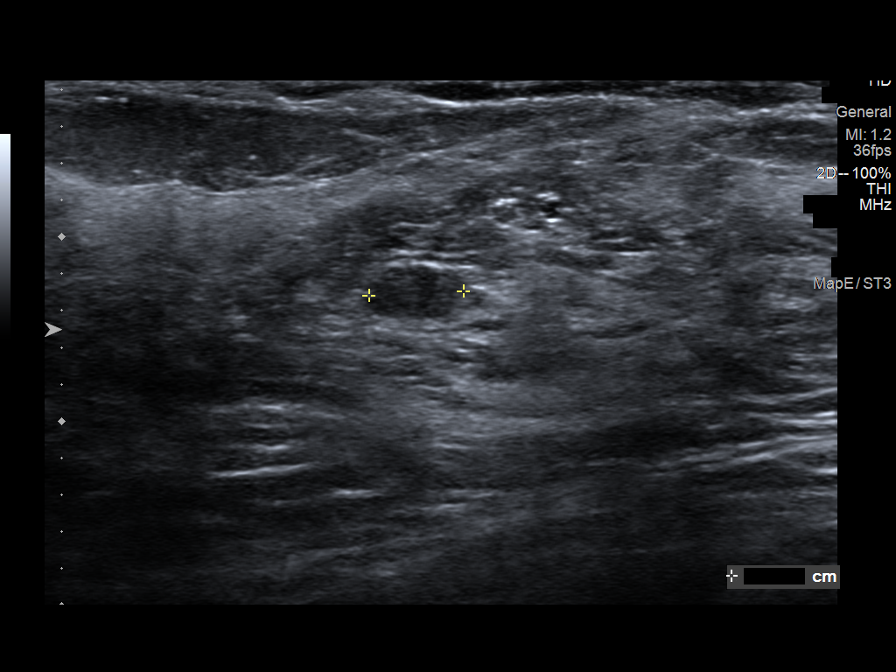
[im 6/7]
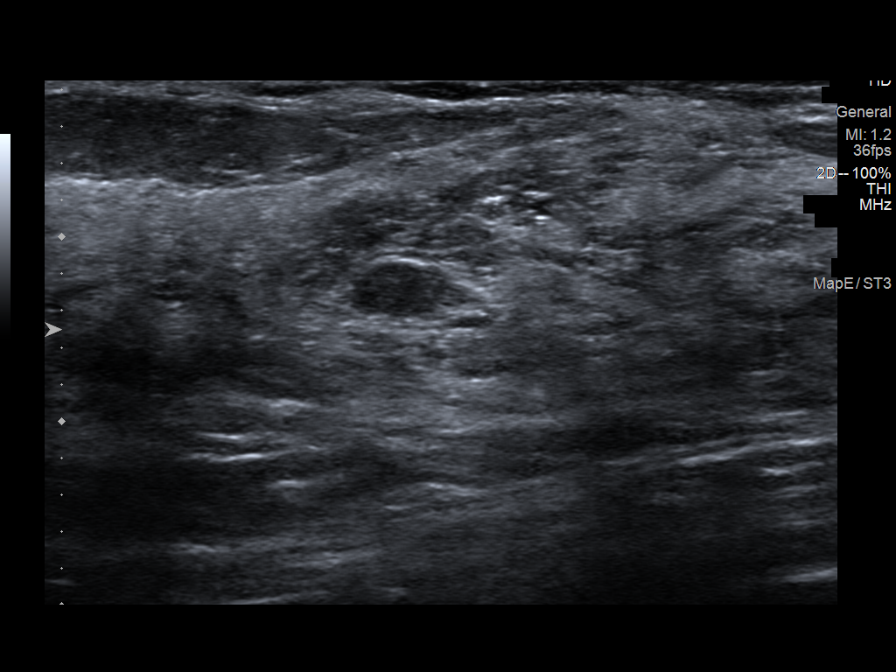
[im 7/7]
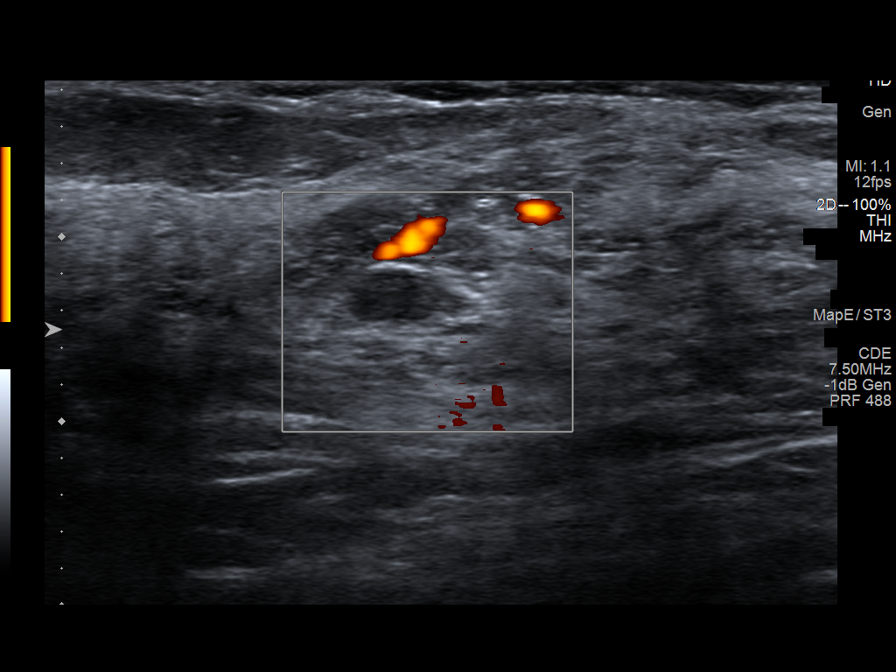

[7 of 7 positions shown; findings below may reference images not displayed]

ACR Breast Density Category c: The breast tissue is heterogeneously
dense, which may obscure small masses.
FINDINGS: There is a persistent, small, oval asymmetry with indistinct margins
in the lateral left breast slightly superiorly in the craniocaudal
projection. There is a corresponding oval, circumscribed mass in the
upper outer left breast in the true lateral projection today and on
the recent and previous oblique images of the left breast. In the
oblique projection, this has been stable since the patient 1st began
getting 3D mammograms in 4696. No interval findings suspicious for
malignancy.

Mammographic images were processed with CAD.

On physical exam, no mass is palpable in the upper outer left
breast.

Targeted ultrasound is performed, showing a 5 x 4 x 3 mm oval,
horizontally oriented, circumscribed, mildly hypoechoic mass in the
2 o'clock position of the left breast, 3 cm from the nipple,
corresponding to the mammographically stable mass.
IMPRESSION: The recently suspected mass in the outer left breast is a 5 mm
probable fibroadenoma which is mammographically stable in the
oblique projection for than 3 years. The long-term stability is
compatible with a benign process and this does not require further
evaluation. No evidence of malignancy.

RECOMMENDATION:
Bilateral screening mammogram in 1 year.

I have discussed the findings and recommendations with the patient.
If applicable, a reminder letter will be sent to the patient
regarding the next appointment.

BI-RADS CATEGORY  2: Benign.

## 2020-06-21 DIAGNOSIS — T7849XA Other allergy, initial encounter: Secondary | ICD-10-CM | POA: Diagnosis not present

## 2020-06-21 DIAGNOSIS — J019 Acute sinusitis, unspecified: Secondary | ICD-10-CM | POA: Diagnosis not present

## 2020-06-21 DIAGNOSIS — R0981 Nasal congestion: Secondary | ICD-10-CM | POA: Diagnosis not present

## 2020-07-24 ENCOUNTER — Other Ambulatory Visit: Payer: Self-pay | Admitting: Obstetrics & Gynecology

## 2020-07-24 DIAGNOSIS — Z1231 Encounter for screening mammogram for malignant neoplasm of breast: Secondary | ICD-10-CM

## 2020-09-05 ENCOUNTER — Ambulatory Visit
Admission: RE | Admit: 2020-09-05 | Discharge: 2020-09-05 | Disposition: A | Discharge status: Home / Self Care | Payer: BC Managed Care – PPO | Source: Ambulatory Visit | Attending: Obstetrics & Gynecology | Admitting: Obstetrics & Gynecology

## 2020-09-05 ENCOUNTER — Other Ambulatory Visit: Payer: Self-pay

## 2020-09-05 DIAGNOSIS — Z1231 Encounter for screening mammogram for malignant neoplasm of breast: Secondary | ICD-10-CM | POA: Diagnosis not present

## 2021-01-31 ENCOUNTER — Ambulatory Visit (INDEPENDENT_AMBULATORY_CARE_PROVIDER_SITE_OTHER): Payer: BC Managed Care – PPO | Admitting: Obstetrics & Gynecology

## 2021-01-31 ENCOUNTER — Encounter: Payer: Self-pay | Admitting: Obstetrics & Gynecology

## 2021-01-31 ENCOUNTER — Other Ambulatory Visit: Payer: Self-pay

## 2021-01-31 ENCOUNTER — Other Ambulatory Visit (HOSPITAL_COMMUNITY)
Admission: RE | Admit: 2021-01-31 | Discharge: 2021-01-31 | Disposition: A | Discharge status: Home / Self Care | Payer: BC Managed Care – PPO | Source: Ambulatory Visit | Attending: Obstetrics & Gynecology | Admitting: Obstetrics & Gynecology

## 2021-01-31 VITALS — BP 124/80 | Ht 64.0 in | Wt 168.0 lb

## 2021-01-31 DIAGNOSIS — Z01419 Encounter for gynecological examination (general) (routine) without abnormal findings: Secondary | ICD-10-CM

## 2021-01-31 DIAGNOSIS — Z90710 Acquired absence of both cervix and uterus: Secondary | ICD-10-CM

## 2021-01-31 DIAGNOSIS — Z1272 Encounter for screening for malignant neoplasm of vagina: Secondary | ICD-10-CM

## 2021-01-31 DIAGNOSIS — Z79890 Hormone replacement therapy (postmenopausal): Secondary | ICD-10-CM | POA: Diagnosis not present

## 2021-01-31 LAB — CBC: RBC: 4.74 10*6/uL (ref 3.80–5.10)

## 2021-01-31 MED ORDER — ESTRADIOL 0.5 MG PO TABS: 0.5000 mg | ORAL_TABLET | Freq: Every day | ORAL | 4 refills | Status: DC

## 2021-01-31 NOTE — Progress Notes (Signed)
Cassandra Vaughn March 29, 1966 [id number]   History:    55 y.o. G42P0A2 Married  HE:[id number] presenting for annual gyn exam   HPI:S/P TAH/Bilateral Salpingectomy. H/O LEEP in 1996, pap tests normal since then. Postmenopause, well on Estradiol 1 mg PO daily x 2015. No hot flushes or night sweats. Sisters with Breast Ca. Patient is BrCa1-2 negative. No pelvic pain. No pain with IC. Normal vaginal secretions. BMI 28.84. Good fitness. Fasting Health Labs here today.  Past medical history,surgical history, family history and social history were all reviewed and documented in the EPIC chart.  Gynecologic History Patient's last menstrual period was 09/28/2013.  Obstetric History OB History  Gravida Para Term Preterm AB Living  2 0     2 0  SAB IAB Ectopic Multiple Live Births  0   0        # Outcome Date GA Lbr Len/2nd Weight Sex Delivery Anes PTL Lv  2 AB           1 AB              ROS: A ROS was performed and pertinent positives and negatives are included in the history.  GENERAL: No fevers or chills. HEENT: No change in vision, no earache, sore throat or sinus congestion. NECK: No pain or stiffness. CARDIOVASCULAR: No chest pain or pressure. No palpitations. PULMONARY: No shortness of breath, cough or wheeze. GASTROINTESTINAL: No abdominal pain, nausea, vomiting or diarrhea, melena or bright red blood per rectum. GENITOURINARY: No urinary frequency, urgency, hesitancy or dysuria. MUSCULOSKELETAL: No joint or muscle pain, no back pain, no recent trauma. DERMATOLOGIC: No rash, no itching, no lesions. ENDOCRINE: No polyuria, polydipsia, no heat or cold intolerance. No recent change in weight. HEMATOLOGICAL: No anemia or easy bruising or bleeding. NEUROLOGIC: No headache, seizures, numbness, tingling or weakness. PSYCHIATRIC: No depression, no loss of interest in normal activity or change in sleep pattern.     Exam:   BP 124/80 (BP Location: Right Arm,  Patient Position: Sitting, Cuff Size: Normal)   Ht _0  (1.626 m)   Wt 168 lb (76.2 kg)   LMP 09/28/2013   BMI 28.84 kg/m   Body mass index is 28.84 kg/m.  General appearance : Well developed well nourished female. No acute distress HEENT: Eyes: no retinal hemorrhage or exudates,  Neck supple, trachea midline, no carotid bruits, no thyroidmegaly Lungs: Clear to auscultation, no rhonchi or wheezes, or rib retractions  Heart: Regular rate and rhythm, no murmurs or gallops Breast:Examined in sitting and supine position were symmetrical in appearance, no palpable masses or tenderness,  no skin retraction, no nipple inversion, no nipple discharge, no skin discoloration, no axillary or supraclavicular lymphadenopathy Abdomen: no palpable masses or tenderness, no rebound or guarding Extremities: no edema or skin discoloration or tenderness  Pelvic: Vulva: Normal             Vagina: No gross lesions or discharge.  Pap reflex done.  Cervix/Uterus absent  Adnexa  Without masses or tenderness  Anus: Normal   Assessment/Plan:  55 y.o. female for annual exam   1. Encounter for Papanicolaou smear of vagina as part of routine gynecological examination Gynecologic exam s/p TAH.  H/O LEEP.  Pap reflex done today.  Breast exam normal.  Screening mammo negative 08/2020.  Health labs here today.  BMI stable at 28.84.  Good fitness and healthy nutrition. - CBC - Comp Met (CMET) - Lipid Profile - TSH - Vitamin D 1,25 dihydroxy -  Cytology - PAP( [facility])  2. S/P TAH (total abdominal hysterectomy)  3. Postmenopausal hormone replacement therapy Well on Estradiol 0.5 mg PO daily.  No CI to continue.  Prescription sent to pharmacy.  Other orders - estradiol (ESTRACE) 0.5 MG tablet; Take 1 tablet (0.5 mg total) by mouth daily.  Princess Bruins MD, 9:19 AM 01/31/2021

## 2021-02-01 LAB — COMPREHENSIVE METABOLIC PANEL
AST: 20 U/L (ref 10–35)
Alkaline phosphatase (APISO): 76 U/L (ref 37–153)
Total Bilirubin: 0.5 mg/dL (ref 0.2–1.2)
Total Protein: 7 g/dL (ref 6.1–8.1)

## 2021-02-01 LAB — LIPID PANEL
LDL Cholesterol (Calc): 124 mg/dL (calc) — ABNORMAL HIGH
Non-HDL Cholesterol (Calc): 139 mg/dL (calc) — ABNORMAL HIGH (ref <130)

## 2021-02-01 LAB — CBC
MCH: 30.4 pg (ref 27.0–33.0)
RDW: 13.1 % (ref 11.0–15.0)
WBC: 4 10*3/uL (ref 3.8–10.8)

## 2021-02-01 LAB — CYTOLOGY - PAP: Diagnosis: NEGATIVE

## 2021-02-04 LAB — COMPREHENSIVE METABOLIC PANEL
AG Ratio: 1.4 (calc) (ref 1.0–2.5)
ALT: 18 U/L (ref 6–29)
Albumin: 4.1 g/dL (ref 3.6–5.1)
BUN: 15 mg/dL (ref 7–25)
CO2: 27 mmol/L (ref 20–32)
Calcium: 9.4 mg/dL (ref 8.6–10.4)
Chloride: 106 mmol/L (ref 98–110)
Creat: 0.9 mg/dL (ref 0.50–1.05)
Globulin: 2.9 g/dL (calc) (ref 1.9–3.7)
Glucose, Bld: 76 mg/dL (ref 65–99)
Potassium: 4.5 mmol/L (ref 3.5–5.3)
Sodium: 140 mmol/L (ref 135–146)

## 2021-02-04 LAB — LIPID PANEL
Cholesterol: 231 mg/dL — ABNORMAL HIGH (ref <200)
HDL: 92 mg/dL (ref >=50)
Total CHOL/HDL Ratio: 2.5 (calc) (ref <5.0)
Triglycerides: 54 mg/dL (ref <150)

## 2021-02-04 LAB — CBC
HCT: 44.4 % (ref 35.0–45.0)
Hemoglobin: 14.4 g/dL (ref 11.7–15.5)
MCHC: 32.4 g/dL (ref 32.0–36.0)
MCV: 93.7 fL (ref 80.0–100.0)
MPV: 13 fL — ABNORMAL HIGH (ref 7.5–12.5)
Platelets: 141 10*3/uL (ref 140–400)

## 2021-02-04 LAB — VITAMIN D 1,25 DIHYDROXY
Vitamin D 1, 25 (OH)2 Total: 44 pg/mL (ref 18–72)
Vitamin D2 1, 25 (OH)2: <8 pg/mL
Vitamin D3 1, 25 (OH)2: 44 pg/mL

## 2021-02-04 LAB — TSH: TSH: 1.19 mIU/L (ref 0.40–4.50)

## 2021-05-31 DIAGNOSIS — Z23 Encounter for immunization: Secondary | ICD-10-CM | POA: Diagnosis not present

## 2021-05-31 DIAGNOSIS — J309 Allergic rhinitis, unspecified: Secondary | ICD-10-CM | POA: Diagnosis not present

## 2021-05-31 DIAGNOSIS — H2513 Age-related nuclear cataract, bilateral: Secondary | ICD-10-CM | POA: Diagnosis not present

## 2021-08-15 ENCOUNTER — Other Ambulatory Visit: Payer: Self-pay | Admitting: Obstetrics & Gynecology

## 2021-08-15 DIAGNOSIS — Z1231 Encounter for screening mammogram for malignant neoplasm of breast: Secondary | ICD-10-CM

## 2021-09-19 ENCOUNTER — Ambulatory Visit: Payer: BC Managed Care – PPO

## 2021-10-22 ENCOUNTER — Ambulatory Visit: Payer: Self-pay

## 2021-11-05 ENCOUNTER — Ambulatory Visit: Payer: Self-pay

## 2021-12-10 ENCOUNTER — Other Ambulatory Visit: Payer: Self-pay | Admitting: Obstetrics & Gynecology

## 2021-12-10 DIAGNOSIS — Z1231 Encounter for screening mammogram for malignant neoplasm of breast: Secondary | ICD-10-CM

## 2021-12-12 ENCOUNTER — Ambulatory Visit
Admission: RE | Admit: 2021-12-12 | Discharge: 2021-12-12 | Disposition: A | Discharge status: Home / Self Care | Payer: Managed Care, Other (non HMO) | Source: Ambulatory Visit

## 2021-12-12 DIAGNOSIS — Z1231 Encounter for screening mammogram for malignant neoplasm of breast: Secondary | ICD-10-CM

## 2022-02-06 ENCOUNTER — Ambulatory Visit: Payer: BC Managed Care – PPO | Admitting: Obstetrics & Gynecology

## 2022-05-03 ENCOUNTER — Encounter: Payer: Self-pay | Admitting: Cardiovascular Disease

## 2022-05-08 ENCOUNTER — Ambulatory Visit
Admission: RE | Admit: 2022-05-08 | Discharge: 2022-05-08 | Disposition: A | Discharge status: Home / Self Care | Payer: Commercial Managed Care - HMO | Source: Ambulatory Visit | Attending: Emergency Medicine | Admitting: Emergency Medicine

## 2022-05-08 VITALS — BP 118/78 | HR 68 | Temp 98.0°F | Resp 18

## 2022-05-08 DIAGNOSIS — H66001 Acute suppurative otitis media without spontaneous rupture of ear drum, right ear: Secondary | ICD-10-CM | POA: Diagnosis not present

## 2022-05-08 DIAGNOSIS — J0140 Acute pansinusitis, unspecified: Secondary | ICD-10-CM | POA: Diagnosis not present

## 2022-05-08 DIAGNOSIS — B379 Candidiasis, unspecified: Secondary | ICD-10-CM

## 2022-05-08 DIAGNOSIS — J309 Allergic rhinitis, unspecified: Secondary | ICD-10-CM

## 2022-05-08 MED ORDER — AMOXICILLIN-POT CLAVULANATE 875-125 MG PO TABS: 1.0000 | ORAL_TABLET | Freq: Two times a day (BID) | ORAL | 0 refills | Status: AC

## 2022-05-08 MED ORDER — IPRATROPIUM BROMIDE 0.06 % NA SOLN: 2.0000 | Freq: Three times a day (TID) | NASAL | 1 refills | Status: DC

## 2022-05-08 MED ORDER — FLUCONAZOLE 150 MG PO TABS: ORAL_TABLET | ORAL | 0 refills | Status: DC

## 2022-05-08 NOTE — ED Provider Notes (Signed)
[facility]    CSN: [id number] Arrival date & time: 05/08/22  1625    HISTORY   Chief Complaint  Patient presents with   Nasal Congestion    Sinusitis - Entered by patient   HPI Cassandra Vaughn is a pleasant, 56 y.o. female who presents to urgent care today. Patient reports an 8-day history of sinus congestion, sinus drainage, postnasal drip, rhinorrhea, intermittent nosebleeds, nonproductive cough and scratchy throat.  Patient denies fever, aches, chills but states when her symptoms began she thought she might of just caught a regular cold.  Patient states she is here today because of sinus congestion has become painful, feels like there is a lot of pressure both above and below both of her eyes.  Patient denies ear pain, pain with swallowing, known sick contacts, nausea, vomiting, diarrhea.  The history is provided by the patient.   Past Medical History:  Diagnosis Date   Allergy    seasonal   Bacterial vaginosis    RECURRENT   BRCA1 negative    BRCA2 negative    GERD (gastroesophageal reflux disease)    no meds   Sinus infection    0n meds   Patient Active Problem List   Diagnosis Date Noted   Family history of breast cancer in sister 11/14/2014   Weight loss 03/24/2012   Family history of diabetes mellitus 03/24/2012   Past Surgical History:  Procedure Laterality Date   ABDOMINAL HYSTERECTOMY N/A 10/11/2013   Procedure: HYSTERECTOMY ABDOMINAL;  Surgeon: Terrance Mass, MD;  Location: [facility];  Service: Gynecology;  Laterality: N/A;   BILATERAL SALPINGECTOMY Bilateral 10/11/2013   Procedure: BILATERAL SALPINGECTOMY;  Surgeon: Terrance Mass, MD;  Location: [facility];  Service: Gynecology;  Laterality: Bilateral;   BREAST EXCISIONAL BIOPSY Right    CERVICAL BIOPSY  W/ LOOP ELECTRODE EXCISION  1996   INDUCED ABORTION     x2   LYSIS OF ADHESION N/A 10/11/2013   Procedure: LYSIS OF ABDOMINAL ADHESIONs;  Surgeon: Terrance Mass, MD;  Location: [facility];   Service: Gynecology;  Laterality: N/A;   MYOMECTOMY  1996/2005   UTERINE ARTERY EMBOLIZATION  06/2004   OB History     Gravida  2   Para  0   Term      Preterm      AB  2   Living  0      SAB  0   IAB      Ectopic  0   Multiple      Live Births             Home Medications    Prior to Admission medications   Medication Sig Start Date End Date Taking? Authorizing Provider  cholecalciferol (VITAMIN D) 1000 units tablet Take by mouth. Vit d 3 25 mcg-Take one daily    [provider]  estradiol (ESTRACE) 0.5 MG tablet Take 1 tablet (0.5 mg total) by mouth daily. 01/31/21   Princess Bruins, MD  loratadine (CLARITIN) 10 MG tablet Take 10 mg by mouth daily.    [provider]  Multiple Vitamin (MULTIVITAMIN WITH MINERALS) TABS tablet Take 1 tablet by mouth daily.    [provider]  Omega-3 Krill Oil 450 MG CAPS Take by mouth.    [provider]    Family History Family History  Problem Relation Age of Onset   Hypertension Mother    Diabetes Father    Hypertension Father    Heart disease  Father    Breast cancer Sister    Hypertension Brother    Heart disease Brother    Multiple sclerosis Sister    Lung cancer Brother    Heart disease Brother    Social History Social History   Tobacco Use   Smoking status: Never   Smokeless tobacco: Never  Vaping Use   Vaping Use: Never used  Substance Use Topics   Alcohol use: No    Alcohol/week: 0.0 standard drinks of alcohol   Drug use: No   Allergies   Patient has no known allergies.  Review of Systems Review of Systems Pertinent findings revealed after performing a 14 point review of systems has been noted in the history of present illness.  Physical Exam Triage Vital Signs ED Triage Vitals  Enc Vitals Group     BP 07/06/21 0827 (!) 147/82     Pulse Rate 07/06/21 0827 72     Resp 07/06/21 0827 18     Temp 07/06/21 0827 98.3 F (36.8 C)     Temp Source 07/06/21  0827 Oral     SpO2 07/06/21 0827 98 %     Weight --      Height --      Head Circumference --      Peak Flow --      Pain Score 07/06/21 0826 5     Pain Loc --      Pain Edu? --      Excl. in [place]? --   No data found.  Updated Vital Signs BP 118/78 (BP Location: Right Arm)   Pulse 68   Temp 98 F (36.7 C) (Oral)   Resp 18   LMP 09/28/2013   SpO2 97%   Physical Exam Vitals and nursing note reviewed.  Constitutional:      General: She is not in acute distress.    Appearance: Normal appearance. She is not ill-appearing.  HENT:     Head: Normocephalic and atraumatic.     Salivary Glands: Right salivary gland is not diffusely enlarged or tender. Left salivary gland is not diffusely enlarged or tender.     Right Ear: Hearing, ear canal and external ear normal. No drainage. A middle ear effusion is present. There is no impacted cerumen. Tympanic membrane is injected, erythematous and retracted. Tympanic membrane is not bulging.     Left Ear: Hearing, ear canal and external ear normal. No drainage. A middle ear effusion is present. There is no impacted cerumen. Tympanic membrane is bulging (Clear fluid). Tympanic membrane is not injected or erythematous.     Nose: Rhinorrhea present. No nasal deformity, septal deviation, signs of injury, laceration, nasal tenderness, mucosal edema or congestion. Rhinorrhea is clear.     Right Nostril: No foreign body, epistaxis, septal hematoma or occlusion.     Left Nostril: No foreign body, epistaxis, septal hematoma or occlusion.     Right Turbinates: Enlarged, swollen and pale.     Left Turbinates: Enlarged, swollen and pale.     Right Sinus: No maxillary sinus tenderness or frontal sinus tenderness.     Left Sinus: No maxillary sinus tenderness or frontal sinus tenderness.     Mouth/Throat:     Lips: Pink. No lesions.     Mouth: Mucous membranes are moist. No oral lesions.     Tongue: No lesions. Tongue does not deviate from midline.     Palate:  No mass and lesions.     Pharynx: Oropharynx is clear. Uvula midline.  No pharyngeal swelling, oropharyngeal exudate, posterior oropharyngeal erythema or uvula swelling.     Tonsils: No tonsillar exudate. 0 on the right. 0 on the left.     Comments: +Postnasal drip Eyes:     General: Lids are normal.        Right eye: No discharge.        Left eye: No discharge.     Extraocular Movements: Extraocular movements intact.     Conjunctiva/sclera: Conjunctivae normal.     Right eye: Right conjunctiva is not injected.     Left eye: Left conjunctiva is not injected.  Neck:     Trachea: Trachea and phonation normal.  Cardiovascular:     Rate and Rhythm: Normal rate and regular rhythm.     Pulses: Normal pulses.     Heart sounds: Normal heart sounds. No murmur heard.    No friction rub. No gallop.  Pulmonary:     Effort: Pulmonary effort is normal. No accessory muscle usage, prolonged expiration or respiratory distress.     Breath sounds: Normal breath sounds. No stridor, decreased air movement or transmitted upper airway sounds. No decreased breath sounds, wheezing, rhonchi or rales.  Chest:     Chest wall: No tenderness.  Musculoskeletal:        General: Normal range of motion.     Cervical back: Normal range of motion and neck supple. Normal range of motion.  Lymphadenopathy:     Cervical: No cervical adenopathy.  Skin:    General: Skin is warm and dry.     Findings: No erythema or rash.  Neurological:     General: No focal deficit present.     Mental Status: She is alert and oriented to person, place, and time.  Psychiatric:        Mood and Affect: Mood normal.        Behavior: Behavior normal.     Visual Acuity Right Eye Distance:   Left Eye Distance:   Bilateral Distance:    Right Eye Near:   Left Eye Near:    Bilateral Near:     UC Couse / Diagnostics / Procedures:     Radiology No results found.  Procedures Procedures (including critical care  time) EKG  Pending results:  Labs Reviewed - No data to display  Medications Ordered in UC: Medications - No data to display  UC Diagnoses / Final Clinical Impressions(s)   I have reviewed the triage vital signs and the nursing notes.  Pertinent labs & imaging results that were available during my care of the patient were reviewed by me and considered in my medical decision making (see chart for details).    Final diagnoses:  Non-recurrent acute suppurative otitis media of right ear without spontaneous rupture of tympanic membrane  Acute non-recurrent pansinusitis  Allergic rhinitis with postnasal drip  Antibiotic-induced yeast infection   Patient provided with a 10-day course of Augmentin for suppurative right otitis media likely secondary to bacterial pansinusitis given duration of symptoms.  Patient provided with Atrovent nasal spray to help dry up nasal secretions.  Patient provided with fluconazole for convenience given the likelihood of her developing vaginal yeast infection secondary to a 10-day course of Augmentin.  Patient advised that she does not pick at this prescription unless she needs it.  ED Prescriptions     Medication Sig Dispense Auth. Provider   amoxicillin-clavulanate (AUGMENTIN) 875-125 MG tablet Take 1 tablet by mouth 2 (two) times daily for 10 days. 20 tablet Lilia Pro,  Lunden Mcleish Scales, PA-C   fluconazole (DIFLUCAN) 150 MG tablet Take 1 tablet today.  Take second tablet 3 days later. 2 tablet Lynden Oxford Scales, PA-C   ipratropium (ATROVENT) 0.06 % nasal spray Place 2 sprays into both nostrils 3 (three) times daily. As needed for nasal congestion, runny nose 15 mL Lynden Oxford Scales, PA-C      PDMP not reviewed this encounter.  Disposition Upon Discharge:  Condition: stable for discharge home Home: take medications as prescribed; routine discharge instructions as discussed; follow up as advised.  Patient presented with an acute illness with associated  systemic symptoms and significant discomfort requiring urgent management. In my opinion, this is a condition that a prudent lay person (someone who possesses an average knowledge of health and medicine) may potentially expect to result in complications if not addressed urgently such as respiratory distress, impairment of bodily function or dysfunction of bodily organs.   Routine symptom specific, illness specific and/or disease specific instructions were discussed with the patient and/or caregiver at length.   As such, the patient has been evaluated and assessed, work-up was performed and treatment was provided in alignment with urgent care protocols and evidence based medicine.  Patient/parent/caregiver has been advised that the patient may require follow up for further testing and treatment if the symptoms continue in spite of treatment, as clinically indicated and appropriate.  If the patient was tested for COVID-19, Influenza and/or RSV, then the patient/parent/guardian was advised to isolate at home pending the results of his/her diagnostic coronavirus test and potentially longer if they're positive. I have also advised pt that if his/her COVID-19 test returns positive, it's recommended to self-isolate for at least 10 days after symptoms first appeared AND until fever-free for 24 hours without fever reducer AND other symptoms have improved or resolved. Discussed self-isolation recommendations as well as instructions for household member/close contacts as per the [facility] and [place] [facility], and also gave patient the [place] packet with this information.  Patient/parent/caregiver has been advised to return to the [facility] or PCP in 3-5 days if no better; to PCP or the Emergency Department if new signs and symptoms develop, or if the current signs or symptoms continue to change or worsen for further workup, evaluation and treatment as clinically indicated and appropriate  The patient will follow up with their current PCP  if and as advised. If the patient does not currently have a PCP we will assist them in obtaining one.   The patient may need specialty follow up if the symptoms continue, in spite of conservative treatment and management, for further workup, evaluation, consultation and treatment as clinically indicated and appropriate.  Patient/parent/caregiver verbalized understanding and agreement of plan as discussed.  All questions were addressed during visit.  Please see discharge instructions below for further details of plan.  Discharge Instructions:   Discharge Instructions      Your symptoms and physical exam findings are concerning for a viral respiratory infection.     Please see the list below for recommended medications, dosages and frequencies to provide relief of your current symptoms:    Augmentin (amoxicillin - clavulanic acid):  take 1 tablet twice daily for 10 days, you can take it with or without food.  This antibiotic can cause upset stomach, this will resolve once antibiotics are complete.  You are welcome to use a probiotic, eat yogurt, take Imodium while taking this medication.  Please avoid other systemic medications such as Maalox, Pepto-Bismol or milk of magnesia as they can  interfere with your body's ability to absorb the antibiotics.   Diflucan (fluconazole): As you may or may not be aware, taking antibiotics can often cause patients to develop a vaginal yeast infection.  For this reason, I have provided you with a prescription for Diflucan.  Please take the first Diflucan tablet on day 3 or 4 of your antibiotic therapy, and take the second Diflucan tablet 3 days later.  You do not need to pick up this prescription or take this medication unless you develop symptoms of vaginal yeast infection including thick, white vaginal discharge and/or vaginal itching.  This prescription has been provided as a Manufacturing engineer and for your convenience.  Atrovent (ipratropium): This is an excellent nasal  decongestant spray that does not cause rebound congestion, please instill 2 sprays into each nare with each use.  Please use the spray up to 4 times daily as needed.  I have provided you with a prescription for this medication.      Advil, Motrin (ibuprofen): This is a good anti-inflammatory medication which not only addresses aches, pains but also significantly reduces soft tissue inflammation of the upper airways that causes sinus and nasal congestion as well as inflammation of the lower airways which makes you feel like your breathing is constricted or your cough feel tight.  I recommend that you take between 400 mg every 8 hours as needed.      Claritin (loratadine): This is an excellent second-generation antihistamine that helps to reduce respiratory inflammatory response to environmental allergens.  This medication is not known to cause daytime sleepiness so it can be taken in the daytime.  If you find that it does make you sleepy, please feel free to take it at bedtime.   Please follow-up within the next 5-7 days either with your primary care provider or urgent care if your symptoms do not resolve.  If you do not have a primary care provider, we will assist you in finding one.   Thank you for visiting urgent care today.  We appreciate the opportunity to participate in your care.       This office note has been dictated using Museum/gallery curator.  Unfortunately, this method of dictation can sometimes lead to typographical or grammatical errors.  I apologize for your inconvenience in advance if this occurs.  Please do not hesitate to reach out to me if clarification is needed.      Lynden Oxford Scales, [place] 05/08/22 1929

## 2022-05-08 NOTE — Discharge Instructions (Signed)
Your symptoms and physical exam findings are concerning for a viral respiratory infection.     Please see the list below for recommended medications, dosages and frequencies to provide relief of your current symptoms:    Augmentin (amoxicillin - clavulanic acid):  take 1 tablet twice daily for 10 days, you can take it with or without food.  This antibiotic can cause upset stomach, this will resolve once antibiotics are complete.  You are welcome to use a probiotic, eat yogurt, take Imodium while taking this medication.  Please avoid other systemic medications such as Maalox, Pepto-Bismol or milk of magnesia as they can interfere with your body's ability to absorb the antibiotics.   Diflucan (fluconazole): As you may or may not be aware, taking antibiotics can often cause patients to develop a vaginal yeast infection.  For this reason, I have provided you with a prescription for Diflucan.  Please take the first Diflucan tablet on day 3 or 4 of your antibiotic therapy, and take the second Diflucan tablet 3 days later.  You do not need to pick up this prescription or take this medication unless you develop symptoms of vaginal yeast infection including thick, white vaginal discharge and/or vaginal itching.  This prescription has been provided as a Manufacturing engineer and for your convenience.  Atrovent (ipratropium): This is an excellent nasal decongestant spray that does not cause rebound congestion, please instill 2 sprays into each nare with each use.  Please use the spray up to 4 times daily as needed.  I have provided you with a prescription for this medication.      Advil, Motrin (ibuprofen): This is a good anti-inflammatory medication which not only addresses aches, pains but also significantly reduces soft tissue inflammation of the upper airways that causes sinus and nasal congestion as well as inflammation of the lower airways which makes you feel like your breathing is constricted or your cough feel tight.  I  recommend that you take between 400 mg every 8 hours as needed.      Claritin (loratadine): This is an excellent second-generation antihistamine that helps to reduce respiratory inflammatory response to environmental allergens.  This medication is not known to cause daytime sleepiness so it can be taken in the daytime.  If you find that it does make you sleepy, please feel free to take it at bedtime.   Please follow-up within the next 5-7 days either with your primary care provider or urgent care if your symptoms do not resolve.  If you do not have a primary care provider, we will assist you in finding one.   Thank you for visiting urgent care today.  We appreciate the opportunity to participate in your care.

## 2022-05-08 NOTE — ED Triage Notes (Signed)
The pt c/o congestion, post nasal drip and sinus pressure. She states she has been taking claritin D.  Started: last Tuesday

## 2023-01-27 ENCOUNTER — Other Ambulatory Visit: Payer: Self-pay | Admitting: Obstetrics & Gynecology

## 2023-01-27 DIAGNOSIS — Z1231 Encounter for screening mammogram for malignant neoplasm of breast: Secondary | ICD-10-CM

## 2023-02-13 ENCOUNTER — Ambulatory Visit
Admission: RE | Admit: 2023-02-13 | Discharge: 2023-02-13 | Disposition: A | Discharge status: Home / Self Care | Payer: No Typology Code available for payment source | Source: Ambulatory Visit

## 2023-02-13 DIAGNOSIS — Z1231 Encounter for screening mammogram for malignant neoplasm of breast: Secondary | ICD-10-CM

## 2023-04-09 ENCOUNTER — Ambulatory Visit (INDEPENDENT_AMBULATORY_CARE_PROVIDER_SITE_OTHER): Payer: BLUE CROSS/BLUE SHIELD | Admitting: Radiology

## 2023-04-09 ENCOUNTER — Encounter: Payer: Self-pay | Admitting: Radiology

## 2023-04-09 VITALS — BP 126/74 | Ht 64.5 in | Wt 176.0 lb

## 2023-04-09 DIAGNOSIS — N951 Menopausal and female climacteric states: Secondary | ICD-10-CM | POA: Diagnosis not present

## 2023-04-09 DIAGNOSIS — Z01419 Encounter for gynecological examination (general) (routine) without abnormal findings: Secondary | ICD-10-CM

## 2023-04-09 DIAGNOSIS — Z79890 Hormone replacement therapy (postmenopausal): Secondary | ICD-10-CM

## 2023-04-09 MED ORDER — ESTRADIOL 0.1 MG/24HR TD PTTW
1.0000 | MEDICATED_PATCH | TRANSDERMAL | 12 refills | Status: DC
Start: 2023-04-10 — End: 2023-05-30

## 2023-04-09 NOTE — Progress Notes (Signed)
   Cassandra Vaughn 08/07/1966 [id number]   History:  57 y.o. G2P0 presents for annual exam. Stopped HRT last year, interested in restarting. Has noticed more joint pain and belly fat since stopping. Sister with hx breast cancer, negative BRCA. LEEP 1996, normal paps since.   Gynecologic History Hysterectomy: TAH fibroids  Sexually active: yes  Health Maintenance Last Pap: 01/31/21. Results were: negative Last mammogram: 02/13/23. Results were: negative Last colonoscopy: 02/2017 HRT [id number]  OB History     Gravida  2   Para  0   Term      Preterm      AB  2   Living  0      SAB  0   IAB      Ectopic  0   Multiple      Live Births              Past medical history, past surgical history, family history and social history were all reviewed and documented in the EPIC chart.  ROS:  A ROS was performed and pertinent positives and negatives are included.  Exam:  Vitals:   04/09/23 0816  BP: 126/74  Weight: 176 lb (79.8 kg)  Height: 5' 4.5" (1.638 m)   Body mass index is 29.74 kg/m.  General appearance:  Normal Thyroid:  Symmetrical, normal in size, without palpable masses or nodularity. Respiratory  Auscultation:  Clear without wheezing or rhonchi Cardiovascular  Auscultation:  Regular rate, without rubs, murmurs or gallops  Edema/varicosities:  Not grossly evident Abdominal  Soft,nontender, without masses, guarding or rebound.  Liver/spleen:  No organomegaly noted  Hernia:  None appreciated  Skin  Inspection:  Grossly normal Breasts: Examined lying and sitting.   Right: Without masses, retractions, nipple discharge or axillary adenopathy.   Left: Without masses, retractions, nipple discharge or axillary adenopathy. Genitourinary   Inguinal/mons:  Normal without inguinal adenopathy  External genitalia:  Normal appearing vulva with no masses, tenderness, or lesions  BUS/Urethra/Skene's glands:  Normal  Vagina:  Normal appearing with  normal color and discharge, no lesions. Atrophy mild  Cervix:  absent  Uterus:  absent  Adnexa/parametria:     Rt: Normal in size, without masses or tenderness.   Lt: Normal in size, without masses or tenderness.  Anus and perineum: Normal   Cassandra Vaughn, CMA present for exam  Assessment/Plan:   1. Well woman exam with routine gynecological exam Pap up to date Labs with PCP  2. Menopausal syndrome on hormone replacement therapy - estradiol (VIVELLE-DOT) 0.1 MG/24HR patch; Place 1 patch (0.1 mg total) onto the skin 2 (two) times a week.  Dispense: 8 patch; Refill: 12    Discussed SBE, colonoscopy and DEXA screening as appropriate. Encouraged 153mins/week of cardiovascular and weight bearing exercise minimum. Recommend the use of seatbelts and sunscreen consistently.   Return in 1 year for annual or sooner prn.  Tanda Rockers WHNP-BC 8:33 AM 04/09/2023

## 2023-05-29 ENCOUNTER — Other Ambulatory Visit: Payer: Self-pay | Admitting: Radiology

## 2023-05-29 DIAGNOSIS — N951 Menopausal and female climacteric states: Secondary | ICD-10-CM

## 2023-05-30 NOTE — Telephone Encounter (Signed)
Med refill request: estradiol patch 0.1mg  #24 requested with refills. Last AEX: 04/09/23 Next AEX: n/a Last MMG (if hormonal med) 02/13/23 Refill sent to provider for approval.
